# Patient Record
Sex: Female | Born: 1990 | Race: Black or African American | Hispanic: No | Marital: Single | Smoking: Current every day smoker
Health system: Southern US, Community
[De-identification: ages and names within clinical notes are randomized; demographics above are authoritative.]

## PROBLEM LIST (undated history)

## (undated) DIAGNOSIS — N83209 Unspecified ovarian cyst, unspecified side: Secondary | ICD-10-CM

## (undated) HISTORY — DX: Unspecified ovarian cyst, unspecified side: N83.209

---
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
## 2008-01-17 ENCOUNTER — Other Ambulatory Visit: Payer: Self-pay

## 2008-01-17 ENCOUNTER — Inpatient Hospital Stay: Payer: Self-pay | Admitting: Internal Medicine

## 2008-04-14 ENCOUNTER — Emergency Department: Payer: Self-pay | Admitting: Unknown Physician Specialty

## 2008-12-08 ENCOUNTER — Emergency Department: Payer: Self-pay | Admitting: Emergency Medicine

## 2009-05-23 ENCOUNTER — Emergency Department: Payer: Self-pay | Admitting: Emergency Medicine

## 2010-05-23 ENCOUNTER — Emergency Department: Payer: Self-pay | Admitting: Internal Medicine

## 2011-01-22 ENCOUNTER — Emergency Department: Payer: Self-pay | Admitting: Emergency Medicine

## 2011-03-20 ENCOUNTER — Emergency Department: Payer: Self-pay | Admitting: Emergency Medicine

## 2011-05-27 ENCOUNTER — Emergency Department: Payer: Self-pay | Admitting: Unknown Physician Specialty

## 2012-03-31 ENCOUNTER — Emergency Department: Payer: Self-pay | Admitting: Emergency Medicine

## 2012-03-31 LAB — CBC
HCT: 38.6 % (ref 35.0–47.0)
MCH: 30.7 pg (ref 26.0–34.0)
MCHC: 31.9 g/dL — ABNORMAL LOW (ref 32.0–36.0)
MCV: 96 fL (ref 80–100)
Platelet: 248 10*3/uL (ref 150–440)
RBC: 4.01 10*6/uL (ref 3.80–5.20)
WBC: 6.5 10*3/uL (ref 3.6–11.0)

## 2012-03-31 LAB — COMPREHENSIVE METABOLIC PANEL
Alkaline Phosphatase: 59 U/L (ref 50–136)
BUN: 14 mg/dL (ref 7–18)
Calcium, Total: 9.1 mg/dL (ref 8.5–10.1)
Chloride: 106 mmol/L (ref 98–107)
Co2: 28 mmol/L (ref 21–32)
Creatinine: 0.93 mg/dL (ref 0.60–1.30)
EGFR (African American): >60
EGFR (Non-African Amer.): >60
Osmolality: 283 (ref 275–301)
SGPT (ALT): 18 U/L
Sodium: 142 mmol/L (ref 136–145)
Total Protein: 8.6 g/dL — ABNORMAL HIGH (ref 6.4–8.2)

## 2012-03-31 LAB — URINALYSIS, COMPLETE
Bacteria: NONE SEEN
Bilirubin,UR: NEGATIVE
Ketone: NEGATIVE
Nitrite: NEGATIVE
Ph: 7 (ref 4.5–8.0)
RBC,UR: 5 /HPF (ref 0–5)
Specific Gravity: 1.023 (ref 1.003–1.030)
WBC UR: 12 /HPF (ref 0–5)

## 2012-03-31 LAB — PREGNANCY, URINE: Pregnancy Test, Urine: NEGATIVE m[IU]/mL

## 2013-03-05 ENCOUNTER — Emergency Department: Payer: Self-pay | Admitting: Unknown Physician Specialty

## 2013-03-05 LAB — CBC
HCT: 39.6 % (ref 35.0–47.0)
HGB: 13.3 g/dL (ref 12.0–16.0)
MCH: 32.3 pg (ref 26.0–34.0)
MCV: 96 fL (ref 80–100)
WBC: 6 10*3/uL (ref 3.6–11.0)

## 2013-03-05 LAB — BASIC METABOLIC PANEL
Calcium, Total: 8.8 mg/dL (ref 8.5–10.1)
Chloride: 107 mmol/L (ref 98–107)
Co2: 26 mmol/L (ref 21–32)
Creatinine: 1.02 mg/dL (ref 0.60–1.30)
EGFR (African American): >60
EGFR (Non-African Amer.): >60
Osmolality: 274 (ref 275–301)

## 2013-03-05 LAB — WET PREP, GENITAL

## 2013-03-05 LAB — URINALYSIS, COMPLETE
Blood: NEGATIVE
Glucose,UR: NEGATIVE mg/dL (ref 0–75)
Ph: 6 (ref 4.5–8.0)
Specific Gravity: 1.021 (ref 1.003–1.030)

## 2013-12-12 ENCOUNTER — Emergency Department: Payer: Self-pay | Admitting: Emergency Medicine

## 2013-12-15 ENCOUNTER — Emergency Department: Payer: Self-pay | Admitting: Emergency Medicine

## 2014-03-30 ENCOUNTER — Emergency Department: Payer: Self-pay | Admitting: Emergency Medicine

## 2014-03-30 LAB — URINALYSIS, COMPLETE
BACTERIA: NONE SEEN
BILIRUBIN, UR: NEGATIVE
Glucose,UR: NEGATIVE mg/dL (ref 0–75)
KETONE: NEGATIVE
NITRITE: NEGATIVE
Ph: 6 (ref 4.5–8.0)
Protein: 30
SPECIFIC GRAVITY: 1.027 (ref 1.003–1.030)
Squamous Epithelial: 12
WBC UR: 12 /HPF (ref 0–5)

## 2014-03-30 LAB — PREGNANCY, URINE: Pregnancy Test, Urine: NEGATIVE m[IU]/mL

## 2014-06-20 ENCOUNTER — Emergency Department: Payer: Self-pay | Admitting: Emergency Medicine

## 2014-07-21 ENCOUNTER — Emergency Department: Payer: Self-pay | Admitting: Emergency Medicine

## 2014-08-06 ENCOUNTER — Emergency Department: Payer: Self-pay | Admitting: Emergency Medicine

## 2014-08-08 LAB — BETA STREP CULTURE(ARMC)

## 2015-02-09 ENCOUNTER — Emergency Department: Payer: Self-pay | Admitting: Emergency Medicine

## 2015-08-09 ENCOUNTER — Emergency Department
Admission: EM | Admit: 2015-08-09 | Discharge: 2015-08-09 | Disposition: A | Discharge status: Home / Self Care | Payer: No Typology Code available for payment source | Attending: Emergency Medicine | Admitting: Emergency Medicine

## 2015-08-09 ENCOUNTER — Encounter: Payer: Self-pay | Admitting: Emergency Medicine

## 2015-08-09 DIAGNOSIS — W2209XA Striking against other stationary object, initial encounter: Secondary | ICD-10-CM | POA: Diagnosis not present

## 2015-08-09 DIAGNOSIS — Y9389 Activity, other specified: Secondary | ICD-10-CM | POA: Insufficient documentation

## 2015-08-09 DIAGNOSIS — Z720 Tobacco use: Secondary | ICD-10-CM | POA: Insufficient documentation

## 2015-08-09 DIAGNOSIS — Y9289 Other specified places as the place of occurrence of the external cause: Secondary | ICD-10-CM | POA: Diagnosis not present

## 2015-08-09 DIAGNOSIS — S0592XA Unspecified injury of left eye and orbit, initial encounter: Secondary | ICD-10-CM | POA: Diagnosis not present

## 2015-08-09 DIAGNOSIS — Y998 Other external cause status: Secondary | ICD-10-CM | POA: Insufficient documentation

## 2015-08-09 MED ORDER — CIPROFLOXACIN HCL 0.3 % OP SOLN
OPHTHALMIC | Status: AC
  Filled 2015-08-09: qty 2.5

## 2015-08-09 MED ORDER — KETOROLAC TROMETHAMINE 0.5 % OP SOLN: 1.0000 [drp] | Freq: Four times a day (QID) | OPHTHALMIC | Status: DC

## 2015-08-09 MED ORDER — CIPROFLOXACIN HCL 0.3 % OP SOLN
2.0000 [drp] | OPHTHALMIC | Status: DC
  Administered 2015-08-09: 2 [drp] via OPHTHALMIC

## 2015-08-09 MED ORDER — HYDROCODONE-ACETAMINOPHEN 5-325 MG PO TABS
1.0000 | ORAL_TABLET | Freq: Once | ORAL | Status: AC
  Administered 2015-08-09: 1 via ORAL
  Filled 2015-08-09: qty 1

## 2015-08-09 MED ORDER — CIPROFLOXACIN HCL 0.3 % OP SOLN: 1.0000 [drp] | OPHTHALMIC | Status: AC

## 2015-08-09 NOTE — Discharge Instructions (Signed)
Please follow up with the eye doctor for symptoms that are not improving over the next 2 days. Return to the ER for symptoms that change or worsen if unable to schedule an appointment.

## 2015-08-09 NOTE — ED Notes (Signed)
Pt to ed with c/o left eye pain after hitting her eye on a table 3 days ago.

## 2015-08-09 NOTE — ED Provider Notes (Signed)
[facility] Emergency Department Provider Note ____________________________________________  Time seen: Approximately 8:23 PM  I have reviewed the triage vital signs and the nursing notes.   HISTORY  Chief Complaint Eye Injury   HPI Alexis Francis is a 24 y.o. female who presents to the emergency department for evaluation of left eye pain. She states that she bumped into a table about 3 days ago, but the pain didn't start until today while at work. She denies other injury. She states that when she can keep the eye open, her vision isnormal.  Pain is mainly in the "top" of the eye.   History reviewed. No pertinent past medical history.  There are no active problems to display for this patient.   History reviewed. No pertinent past surgical history.  Current Outpatient Rx  Name  Route  Sig  Dispense  Refill  . ciprofloxacin (CILOXAN) 0.3 % ophthalmic solution   Right Eye   Place 1 drop into the right eye every 2 (two) hours. Administer 1 drop, every 2 hours, while awake, for 2 days. Then 1 drop, every 4 hours, while awake, for the next 5 days.   5 mL   0   . ketorolac (ACULAR) 0.5 % ophthalmic solution   Left Eye   Place 1 drop into the left eye 4 (four) times daily.   5 mL   0     Allergies Review of patient's allergies indicates no known allergies.  History reviewed. No pertinent family history.  Social History Social History  Substance Use Topics  . Smoking status: Current Every Day Smoker  . Smokeless tobacco: None  . Alcohol Use: No    Review of Systems   Constitutional: No fever/chills Eyes: Visual changes: no. ENT: No sore throat. Cardiovascular: Denies chest pain. Respiratory: Denies shortness of breath. Gastrointestinal: No abdominal pain.  No nausea, no vomiting.  No diarrhea.  No constipation. Musculoskeletal: Negative for pain. Skin: Negative for rash. Neurological: Negative for headaches, focal weakness or  numbness. Psychiatric:At baseline, no complaint Lymphatic:Swollen nodes-- no Allergic: Seasonal allergies: no 10-point ROS otherwise negative.  ____________________________________________  PHYSICAL EXAM:  VITAL SIGNS: ED Triage Vitals  Enc Vitals Group     BP 08/09/15 1852 138/98 mmHg     Pulse Rate 08/09/15 1852 85     Resp 08/09/15 1852 20     Temp 08/09/15 1852 98.6 F (37 C)     Temp src --      SpO2 08/09/15 1852 98 %     Weight 08/09/15 1852 256 lb (116.121 kg)     Height 08/09/15 1852 5\' 5"  (1.651 m)     Head Cir --      Peak Flow --      Pain Score 08/09/15 1853 5     Pain Loc --      Pain Edu? --      Excl. in GC? --     Constitutional: Alert and oriented. Well appearing and in no acute distress. Eyes: Visual acuity--see nursing documentation; No globe trauma; Eyelids normal to inspection; Everted for exam yes; Conjunctiva and sclera: erythematous; Corneas: no abrasion ; Examined with fluorescein yes; EOM's intact; Pupils PERRLA; Anterior Chambers normal with limited exam.  Head: Atraumatic. Nose: No congestion/rhinnorhea. Mouth/Throat: Mucous membranes are moist.  Oropharynx non-erythematous. Neck: No stridor.  Cardiovascular: Normal rate, regular rhythm. Grossly normal heart sounds.  Good peripheral circulation. Respiratory: Normal respiratory effort.  No retractions. Gastrointestinal: Soft and nontender. No distention. No abdominal  bruits. No CVA tenderness. Musculoskeletal:Normal ROM Neurologic:  Normal speech and language. No gross focal neurologic deficits are appreciated. Speech is normal. No gait instability. Skin:  Skin is warm, dry and intact. No rash noted. Psychiatric: Mood and affect are normal. Speech and behavior are normal.  ____________________________________________   LABS (all labs ordered are listed, but only abnormal results are displayed)  Labs Reviewed - No data to  display ____________________________________________  EKG  ____________________________________________  RADIOLOGY  ____________________________________________   PROCEDURES  Procedure(s) performed:   TonoPen used to measure pressure in left eye. Pressure read as 12.  ____________________________________________   INITIAL IMPRESSION / ASSESSMENT AND PLAN / ED COURSE  Pertinent labs & imaging results that were available during my care of the patient were reviewed by me and considered in my medical decision making (see chart for details).  Patient was advised to follow up with opthalmology for symptoms that are not improving over the next 2 days. She was  also advised to return to the ER for symptoms that change or worsen if unable to schedule an appointment.  ____________________________________________   FINAL CLINICAL IMPRESSION(S) / ED DIAGNOSES  Final diagnoses:  Eye injury, non-penetrating, left, initial encounter       Chinita Pester, FNP 08/09/15 2117  Jennye Moccasin, MD 08/09/15 2222

## 2015-12-30 ENCOUNTER — Encounter: Payer: Self-pay | Admitting: Emergency Medicine

## 2015-12-30 ENCOUNTER — Emergency Department
Admission: EM | Admit: 2015-12-30 | Discharge: 2015-12-30 | Disposition: A | Discharge status: Home / Self Care | Payer: No Typology Code available for payment source | Attending: Student | Admitting: Student

## 2015-12-30 DIAGNOSIS — R350 Frequency of micturition: Secondary | ICD-10-CM | POA: Diagnosis present

## 2015-12-30 DIAGNOSIS — Z791 Long term (current) use of non-steroidal anti-inflammatories (NSAID): Secondary | ICD-10-CM | POA: Diagnosis not present

## 2015-12-30 DIAGNOSIS — B373 Candidiasis of vulva and vagina: Secondary | ICD-10-CM | POA: Diagnosis not present

## 2015-12-30 DIAGNOSIS — A5901 Trichomonal vulvovaginitis: Secondary | ICD-10-CM | POA: Diagnosis not present

## 2015-12-30 DIAGNOSIS — B9689 Other specified bacterial agents as the cause of diseases classified elsewhere: Secondary | ICD-10-CM

## 2015-12-30 DIAGNOSIS — N760 Acute vaginitis: Secondary | ICD-10-CM | POA: Insufficient documentation

## 2015-12-30 DIAGNOSIS — F17200 Nicotine dependence, unspecified, uncomplicated: Secondary | ICD-10-CM | POA: Diagnosis not present

## 2015-12-30 DIAGNOSIS — B3731 Acute candidiasis of vulva and vagina: Secondary | ICD-10-CM

## 2015-12-30 LAB — URINALYSIS COMPLETE WITH MICROSCOPIC (ARMC ONLY)
BACTERIA UA: NONE SEEN
BILIRUBIN URINE: NEGATIVE
GLUCOSE, UA: NEGATIVE mg/dL
HGB URINE DIPSTICK: NEGATIVE
Ketones, ur: NEGATIVE mg/dL
Nitrite: NEGATIVE
Protein, ur: NEGATIVE mg/dL
Specific Gravity, Urine: 1.019 (ref 1.005–1.030)
pH: 5 (ref 5.0–8.0)

## 2015-12-30 LAB — WET PREP, GENITAL: Sperm: NONE SEEN

## 2015-12-30 LAB — CHLAMYDIA/NGC RT PCR (ARMC ONLY)
CHLAMYDIA TR: NOT DETECTED
N GONORRHOEAE: NOT DETECTED

## 2015-12-30 MED ORDER — METRONIDAZOLE 500 MG PO TABS: 500.0000 mg | ORAL_TABLET | Freq: Two times a day (BID) | ORAL | Status: DC

## 2015-12-30 MED ORDER — FLUCONAZOLE 150 MG PO TABS: 150.0000 mg | ORAL_TABLET | Freq: Every day | ORAL | Status: DC

## 2015-12-30 NOTE — Discharge Instructions (Signed)
Bacterial Vaginosis Bacterial vaginosis is an infection of the vagina. It happens when too many germs (bacteria) grow in the vagina. Having this infection puts you at risk for getting other infections from sex. Treating this infection can help lower your risk for other infections, such as:   Chlamydia.  Gonorrhea.  HIV.  Herpes. HOME CARE  Take your medicine as told by your doctor.  Finish your medicine even if you start to feel better.  Tell your sex partner that you have an infection. They should see their doctor for treatment.  During treatment:  Avoid sex or use condoms correctly.  Do not douche.  Do not drink alcohol unless your doctor tells you it is ok.  Do not breastfeed unless your doctor tells you it is ok. GET HELP IF:  You are not getting better after 3 days of treatment.  You have more grey fluid (discharge) coming from your vagina than before.  You have more pain than before.  You have a fever. MAKE SURE YOU:   Understand these instructions.  Will watch your condition.  Will get help right away if you are not doing well or get worse.   This information is not intended to replace advice given to you by your health care provider. Make sure you discuss any questions you have with your health care provider.   Document Released: 09/18/2008 Document Revised: 12/31/2014 Document Reviewed: 07/22/2013 Elsevier Interactive Patient Education 2016 [employer]    Take all medication as prescribed. Do not mix alcohol with Flagyl. Follow-up with health department if any continued problems. Have partner tested at the health Department.

## 2015-12-30 NOTE — ED Notes (Signed)
Reports burning with urination

## 2015-12-30 NOTE — ED Provider Notes (Signed)
[facility] Emergency Department Provider Note  ____________________________________________  Time seen: Approximately 9:25 AM  I have reviewed the triage vital signs and the nursing notes.   HISTORY  Chief Complaint Hematuria  HPI Alexis Francis is a 25 y.o. female is here with complaint of urinary frequency and urgency 2 weeks. Patient states she has low back pain and had a vaginal discharge prior to the symptoms above. She reports burning on urination, no fever no chills and no CVA tenderness. Patient states she had a urinary tract infection a year ago and doesn't believe that it never cleared up however she was never seen again due to the fact she does not have primary care doctor.She is unaware of her partner having any problems such as discharge. She rates her pain as 5 out of 10.   History reviewed. No pertinent past medical history.  There are no active problems to display for this patient.   History reviewed. No pertinent past surgical history.  Current Outpatient Rx  Name  Route  Sig  Dispense  Refill  . fluconazole (DIFLUCAN) 150 MG tablet   Oral   Take 1 tablet (150 mg total) by mouth daily.   1 tablet   0   . ketorolac (ACULAR) 0.5 % ophthalmic solution   Left Eye   Place 1 drop into the left eye 4 (four) times daily.   5 mL   0   . metroNIDAZOLE (FLAGYL) 500 MG tablet   Oral   Take 1 tablet (500 mg total) by mouth 2 (two) times daily.   14 tablet   0     Allergies Review of patient's allergies indicates no known allergies.  History reviewed. No pertinent family history.  Social History Social History  Substance Use Topics  . Smoking status: Current Every Day Smoker  . Smokeless tobacco: None  . Alcohol Use: No    Review of Systems Constitutional: No fever/chills ENT: No sore throat. Cardiovascular: Denies chest pain. Respiratory: Denies shortness of breath. Gastrointestinal: No abdominal pain.  No nausea, no  vomiting.   Genitourinary: Positive for dysuria. Positive vaginal discharge Musculoskeletal: Negative for back pain. Skin: Negative for rash. Neurological: Negative for headaches, focal weakness or numbness.  10-point ROS otherwise negative.  ____________________________________________   PHYSICAL EXAM:  VITAL SIGNS: ED Triage Vitals  Enc Vitals Group     BP 12/30/15 0851 118/78 mmHg     Pulse Rate 12/30/15 0851 78     Resp 12/30/15 0851 18     Temp 12/30/15 0851 98.4 F (36.9 C)     Temp Source 12/30/15 0851 Oral     SpO2 12/30/15 0851 100 %     Weight 12/30/15 0851 260 lb (117.935 kg)     Height 12/30/15 0851 5\' 5"  (1.651 m)     Head Cir --      Peak Flow --      Pain Score 12/30/15 0851 5     Pain Loc --      Pain Edu? --      Excl. in GC? --     Constitutional: Alert and oriented. Well appearing and in no acute distress. Eyes: Conjunctivae are normal. PERRL. EOMI. Head: Atraumatic. Nose: No congestion/rhinnorhea. Neck: No stridor.   Cardiovascular: Normal rate, regular rhythm. Grossly normal heart sounds.  Good peripheral circulation. Respiratory: Normal respiratory effort.  No retractions. Lungs CTAB. Gastrointestinal: Soft and nontender. No distention. No CVA tenderness. Genitourinary: Pelvic exam shows white thick vaginal secretions.  No adnexal masses or tenderness noted and no cervical motion tenderness present. Wet prep and cultures were obtained. Musculoskeletal: Moves upper and lower extremities without any difficulty. Normal gait was noted. Neurologic:  Normal speech and language. No gross focal neurologic deficits are appreciated. No gait instability. Skin:  Skin is warm, dry and intact. No rash noted. Psychiatric: Mood and affect are normal. Speech and behavior are normal.  ____________________________________________   LABS (all labs ordered are listed, but only abnormal results are displayed)  Labs Reviewed  WET PREP, GENITAL - Abnormal; Notable  for the following:    Yeast Wet Prep HPF POC PRESENT (*)    Trich, Wet Prep PRESENT (*)    Clue Cells Wet Prep HPF POC PRESENT (*)    WBC, Wet Prep HPF POC RARE (*)    All other components within normal limits  URINALYSIS COMPLETEWITH MICROSCOPIC ([facility] ONLY) - Abnormal; Notable for the following:    Color, Urine YELLOW (*)    APPearance HAZY (*)    Leukocytes, UA 1+ (*)    Squamous Epithelial / LPF 0-5 (*)    All other components within normal limits  CHLAMYDIA/NGC RT PCR ([facility] ONLY)   PROCEDURES  Procedure(s) performed: None  Critical Care performed: No  ____________________________________________   INITIAL IMPRESSION / ASSESSMENT AND PLAN / ED COURSE  Pertinent labs & imaging results that were available during my care of the patient were reviewed by me and considered in my medical decision making (see chart for details).  Wet prep was positive for yeast, Trichomonas, and clue cells. Patient was treated with Diflucan 150 mg by mouth and Flagyl 500 mg twice a day for 7 days. Patient is to follow-up with [facility] health Department and also tell her sexual partner to go to the health Department as well. ____________________________________________   FINAL CLINICAL IMPRESSION(S) / ED DIAGNOSES  Final diagnoses:  Trichomonas vaginitis  Bacterial vaginal infection  Yeast vaginitis      Tommi Rumps, PA-C 12/30/15 1143  Gayla Doss, MD 12/30/15 1547

## 2015-12-30 NOTE — ED Notes (Signed)
Pt states urinary frequency and urgency, states lower back pain, denies any blood in her urine

## 2016-02-10 ENCOUNTER — Emergency Department
Admission: EM | Admit: 2016-02-10 | Discharge: 2016-02-10 | Disposition: A | Discharge status: Home / Self Care | Payer: No Typology Code available for payment source | Attending: Emergency Medicine | Admitting: Emergency Medicine

## 2016-02-10 ENCOUNTER — Encounter: Payer: Self-pay | Admitting: Emergency Medicine

## 2016-02-10 DIAGNOSIS — X501XXA Overexertion from prolonged static or awkward postures, initial encounter: Secondary | ICD-10-CM | POA: Insufficient documentation

## 2016-02-10 DIAGNOSIS — Z792 Long term (current) use of antibiotics: Secondary | ICD-10-CM | POA: Diagnosis not present

## 2016-02-10 DIAGNOSIS — Z79899 Other long term (current) drug therapy: Secondary | ICD-10-CM | POA: Insufficient documentation

## 2016-02-10 DIAGNOSIS — F17200 Nicotine dependence, unspecified, uncomplicated: Secondary | ICD-10-CM | POA: Insufficient documentation

## 2016-02-10 DIAGNOSIS — Y9289 Other specified places as the place of occurrence of the external cause: Secondary | ICD-10-CM | POA: Diagnosis not present

## 2016-02-10 DIAGNOSIS — Y990 Civilian activity done for income or pay: Secondary | ICD-10-CM | POA: Diagnosis not present

## 2016-02-10 DIAGNOSIS — M62830 Muscle spasm of back: Secondary | ICD-10-CM | POA: Insufficient documentation

## 2016-02-10 DIAGNOSIS — Y93F2 Activity, caregiving, lifting: Secondary | ICD-10-CM | POA: Diagnosis not present

## 2016-02-10 DIAGNOSIS — Z791 Long term (current) use of non-steroidal anti-inflammatories (NSAID): Secondary | ICD-10-CM | POA: Insufficient documentation

## 2016-02-10 DIAGNOSIS — M549 Dorsalgia, unspecified: Secondary | ICD-10-CM | POA: Diagnosis present

## 2016-02-10 MED ORDER — NAPROXEN 500 MG PO TABS: 500.0000 mg | ORAL_TABLET | Freq: Two times a day (BID) | ORAL | Status: DC

## 2016-02-10 MED ORDER — METHOCARBAMOL 500 MG PO TABS: 500.0000 mg | ORAL_TABLET | Freq: Four times a day (QID) | ORAL | Status: DC

## 2016-02-10 NOTE — ED Provider Notes (Signed)
[facility] Emergency Department Provider Note  ____________________________________________  Time seen: Approximately 11:03 AM  I have reviewed the triage vital signs and the nursing notes.   HISTORY  Chief Complaint Back Pain    HPI Alexis Francis is a 25 y.o. female who presents emergency department complaining of mid back pain 2-3 days. Patient denies any injury but states that she does lifting at work. Patient states it is a tight/burning sensation. She denies any urinary complaints of hematuria, polyuria, dysuria. Patient denies any radicular symptoms at this time.   History reviewed. No pertinent past medical history.  There are no active problems to display for this patient.   History reviewed. No pertinent past surgical history.  Current Outpatient Rx  Name  Route  Sig  Dispense  Refill  . fluconazole (DIFLUCAN) 150 MG tablet   Oral   Take 1 tablet (150 mg total) by mouth daily.   1 tablet   0   . ketorolac (ACULAR) 0.5 % ophthalmic solution   Left Eye   Place 1 drop into the left eye 4 (four) times daily.   5 mL   0   . methocarbamol (ROBAXIN) 500 MG tablet   Oral   Take 1 tablet (500 mg total) by mouth 4 (four) times daily.   16 tablet   0   . metroNIDAZOLE (FLAGYL) 500 MG tablet   Oral   Take 1 tablet (500 mg total) by mouth 2 (two) times daily.   14 tablet   0   . naproxen (NAPROSYN) 500 MG tablet   Oral   Take 1 tablet (500 mg total) by mouth 2 (two) times daily with a meal.   60 tablet   0     Allergies Review of patient's allergies indicates no known allergies.  No family history on file.  Social History Social History  Substance Use Topics  . Smoking status: Current Every Day Smoker  . Smokeless tobacco: None  . Alcohol Use: No     Review of Systems  Constitutional: No fever/chills Cardiovascular: no chest pain. Respiratory: no cough. No SOB. Gastrointestinal: No abdominal pain.  No nausea, no  vomiting.  No diarrhea.  No constipation. Genitourinary: Negative for dysuria. No hematuria Musculoskeletal: Positive for bilateral mid back pain. Skin: Negative for rash. Neurological: Negative for headaches, focal weakness or numbness. 10-point ROS otherwise negative.  ____________________________________________   PHYSICAL EXAM:  VITAL SIGNS: ED Triage Vitals  Enc Vitals Group     BP 02/10/16 0920 121/69 mmHg     Pulse Rate 02/10/16 0920 82     Resp 02/10/16 0920 18     Temp 02/10/16 0920 98.2 F (36.8 C)     Temp Source 02/10/16 0920 Oral     SpO2 02/10/16 0920 100 %     Weight 02/10/16 0920 260 lb (117.935 kg)     Height 02/10/16 0920  (1.651 m)     Head Cir --      Peak Flow --      Pain Score 02/10/16 0921 4     Pain Loc --      Pain Edu? --      Excl. in GC? --      Constitutional: Alert and oriented. Well appearing and in no acute distress. Eyes: Conjunctivae are normal. PERRL. EOMI. Head: Atraumatic. Neck: No stridor.  No cervical spine tenderness to palpation. Cardiovascular: Normal rate, regular rhythm. Normal S1 and S2.  Good peripheral circulation. Respiratory: Normal  respiratory effort without tachypnea or retractions. Lungs CTAB. Gastrointestinal:  No CVA tenderness. Musculoskeletal: No lower extremity tenderness nor edema.  No joint effusions. No visible deformity to spine upon inspection. Patient is diffusely tender palpation over the lower thoracic paraspinal muscle groups. Mild spasms are palpated. No palpable abnormality. Negative straight leg raise bilaterally. Dorsalis pedis pulse intact bilaterally. Sensation intact and equal lower extremities. Neurologic:  Normal speech and language. No gross focal neurologic deficits are appreciated.  Skin:  Skin is warm, dry and intact. No rash noted. Psychiatric: Mood and affect are normal. Speech and behavior are normal. Patient exhibits appropriate insight and  judgement.   ____________________________________________   LABS (all labs ordered are listed, but only abnormal results are displayed)  Labs Reviewed - No data to display ____________________________________________  EKG   ____________________________________________  RADIOLOGY   No results found.  ____________________________________________    PROCEDURES  Procedure(s) performed:       Medications - No data to display   ____________________________________________   INITIAL IMPRESSION / ASSESSMENT AND PLAN / ED COURSE  Pertinent labs & imaging results that were available during my care of the patient were reviewed by me and considered in my medical decision making (see chart for details).  Patient's diagnosis is consistent with paraspinal muscle spasms. Patient will be discharged home with prescriptions for anti-inflammatories and muscle relaxers. Patient is to follow up with primary care provider if symptoms persist past this treatment course. Patient is given ED precautions to return to the ED for any worsening or new symptoms.     ____________________________________________  FINAL CLINICAL IMPRESSION(S) / ED DIAGNOSES  Final diagnoses:  Lumbar paraspinal muscle spasm      NEW MEDICATIONS STARTED DURING THIS VISIT:  New Prescriptions   METHOCARBAMOL (ROBAXIN) 500 MG TABLET    Take 1 tablet (500 mg total) by mouth 4 (four) times daily.   NAPROXEN (NAPROSYN) 500 MG TABLET    Take 1 tablet (500 mg total) by mouth 2 (two) times daily with a meal.        Racheal Patches, PA-C 02/10/16 1114  Jennye Moccasin, MD 02/10/16 1158

## 2016-02-10 NOTE — Discharge Instructions (Signed)
Back Exercises °The following exercises strengthen the muscles that help to support the back. They also help to keep the lower back flexible. Doing these exercises can help to prevent back pain or lessen existing pain. °If you have back pain or discomfort, try doing these exercises 2-3 times each day or as told by your health care provider. When the pain goes away, do them once each day, but increase the number of times that you repeat the steps for each exercise (do more repetitions). If you do not have back pain or discomfort, do these exercises once each day or as told by your health care provider. °EXERCISES °Single Knee to Chest °Repeat these steps 3-5 times for each leg: °· Lie on your back on a firm bed or the floor with your legs extended. °· Bring one knee to your chest. Your other leg should stay extended and in contact with the floor. °· Hold your knee in place by grabbing your knee or thigh. °· Pull on your knee until you feel a gentle stretch in your lower back. °· Hold the stretch for 10-30 seconds. °· Slowly release and straighten your leg. °Pelvic Tilt °Repeat these steps 5-10 times: °· Lie on your back on a firm bed or the floor with your legs extended. °· Bend your knees so they are pointing toward the ceiling and your feet are flat on the floor. °· Tighten your lower abdominal muscles to press your lower back against the floor. This motion will tilt your pelvis so your tailbone points up toward the ceiling instead of pointing to your feet or the floor. °· With gentle tension and even breathing, hold this position for 5-10 seconds. °Cat-Cow °Repeat these steps until your lower back becomes more flexible: °· Get into a hands-and-knees position on a firm surface. Keep your hands under your shoulders, and keep your knees under your hips. You may place padding under your knees for comfort. °· Let your head hang down, and point your tailbone toward the floor so your lower back becomes rounded like the  back of a cat. °· Hold this position for 5 seconds. °· Slowly lift your head and point your tailbone up toward the ceiling so your back forms a sagging arch like the back of a cow. °· Hold this position for 5 seconds. °Press-Ups °Repeat these steps 5-10 times: °· Lie on your abdomen (face-down) on the floor. °· Place your palms near your head, about shoulder-width apart. °· While you keep your back as relaxed as possible and keep your hips on the floor, slowly straighten your arms to raise the top half of your body and lift your shoulders. Do not use your back muscles to raise your upper torso. You may adjust the placement of your hands to make yourself more comfortable. °· Hold this position for 5 seconds while you keep your back relaxed. °· Slowly return to lying flat on the floor. °Bridges °Repeat these steps 10 times: °· Lie on your back on a firm surface. °· Bend your knees so they are pointing toward the ceiling and your feet are flat on the floor. °· Tighten your buttocks muscles and lift your buttocks off of the floor until your waist is at almost the same height as your knees. You should feel the muscles working in your buttocks and the back of your thighs. If you do not feel these muscles, slide your feet 1-2 inches farther away from your buttocks. °· Hold this position for 3-5   seconds. °· Slowly lower your hips to the starting position, and allow your buttocks muscles to relax completely. °If this exercise is too easy, try doing it with your arms crossed over your chest. °Abdominal Crunches °Repeat these steps 5-10 times: °1. Lie on your back on a firm bed or the floor with your legs extended. °2. Bend your knees so they are pointing toward the ceiling and your feet are flat on the floor. °3. Cross your arms over your chest. °4. Tip your chin slightly toward your chest without bending your neck. °5. Tighten your abdominal muscles and slowly raise your trunk (torso) high enough to lift your shoulder blades  a tiny bit off of the floor. Avoid raising your torso higher than that, because it can put too much stress on your low back and it does not help to strengthen your abdominal muscles. °6. Slowly return to your starting position. °Back Lifts °Repeat these steps 5-10 times: °1. Lie on your abdomen (face-down) with your arms at your sides, and rest your forehead on the floor. °2. Tighten the muscles in your legs and your buttocks. °3. Slowly lift your chest off of the floor while you keep your hips pressed to the floor. Keep the back of your head in line with the curve in your back. Your eyes should be looking at the floor. °4. Hold this position for 3-5 seconds. °5. Slowly return to your starting position. °SEEK MEDICAL CARE IF: °· Your back pain or discomfort gets much worse when you do an exercise. °· Your back pain or discomfort does not lessen within 2 hours after you exercise. °If you have any of these problems, stop doing these exercises right away. Do not do them again unless your health care provider says that you can. °SEEK IMMEDIATE MEDICAL CARE IF: °· You develop sudden, severe back pain. If this happens, stop doing the exercises right away. Do not do them again unless your health care provider says that you can. °  °This information is not intended to replace advice given to you by your health care provider. Make sure you discuss any questions you have with your health care provider. °  °Document Released: 01/17/2005 Document Revised: 08/31/2015 Document Reviewed: 02/03/2015 °Elsevier Interactive Patient Education ©2016 [employer] ° °Heat Therapy °Heat therapy can help ease sore, stiff, injured, and tight muscles and joints. Heat relaxes your muscles, which may help ease your pain.  °RISKS AND COMPLICATIONS °If you have any of the following conditions, do not use heat therapy unless your health care provider has approved: °· Poor circulation. °· Healing wounds or scarred skin in the area being  treated. °· Diabetes, heart disease, or high blood pressure. °· Not being able to feel (numbness) the area being treated. °· Unusual swelling of the area being treated. °· Active infections. °· Blood clots. °· Cancer. °· Inability to communicate pain. This may include young children and people who have problems with their brain function (dementia). °· Pregnancy. °Heat therapy should only be used on old, pre-existing, or long-lasting (chronic) injuries. Do not use heat therapy on new injuries unless directed by your health care provider. °HOW TO USE HEAT THERAPY °There are several different kinds of heat therapy, including: °· Moist heat pack. °· Warm water bath. °· Hot water bottle. °· Electric heating pad. °· Heated gel pack. °· Heated wrap. °· Electric heating pad. °Use the heat therapy method suggested by your health care provider. Follow your health care provider's instructions on when   and how to use heat therapy. °GENERAL HEAT THERAPY RECOMMENDATIONS °· Do not sleep while using heat therapy. Only use heat therapy while you are awake. °· Your skin may turn pink while using heat therapy. Do not use heat therapy if your skin turns red. °· Do not use heat therapy if you have new pain. °· High heat or long exposure to heat can cause burns. Be careful when using heat therapy to avoid burning your skin. °· Do not use heat therapy on areas of your skin that are already irritated, such as with a rash or sunburn. °SEEK MEDICAL CARE IF: °· You have blisters, redness, swelling, or numbness. °· You have new pain. °· Your pain is worse. °MAKE SURE YOU: °· Understand these instructions. °· Will watch your condition. °· Will get help right away if you are not doing well or get worse. °  °This information is not intended to replace advice given to you by your health care provider. Make sure you discuss any questions you have with your health care provider. °  °Document Released: 03/03/2012 Document Revised: 12/31/2014 Document  Reviewed: 02/02/2014 °Elsevier Interactive Patient Education ©2016 [employer] ° °Muscle Cramps and Spasms °Muscle cramps and spasms occur when a muscle or muscles tighten and you have no control over this tightening (involuntary muscle contraction). They are a common problem and can develop in any muscle. The most common place is in the calf muscles of the leg. Both muscle cramps and muscle spasms are involuntary muscle contractions, but they also have differences:  °· Muscle cramps are sporadic and painful. They may last a few seconds to a quarter of an hour. Muscle cramps are often more forceful and last longer than muscle spasms. °· Muscle spasms may or may not be painful. They may also last just a few seconds or much longer. °CAUSES  °It is uncommon for cramps or spasms to be due to a serious underlying problem. In many cases, the cause of cramps or spasms is unknown. Some common causes are:  °· Overexertion.   °· Overuse from repetitive motions (doing the same thing over and over).   °· Remaining in a certain position for a long period of time.   °· Improper preparation, form, or technique while performing a sport or activity.   °· Dehydration.   °· Injury.   °· Side effects of some medicines.   °· Abnormally low levels of the salts and ions in your blood (electrolytes), especially potassium and calcium. This could happen if you are taking water pills (diuretics) or you are pregnant.   °Some underlying medical problems can make it more likely to develop cramps or spasms. These include, but are not limited to:  °· Diabetes.   °· Parkinson disease.   °· Hormone disorders, such as thyroid problems.   °· Alcohol abuse.   °· Diseases specific to muscles, joints, and bones.   °· Blood vessel disease where not enough blood is getting to the muscles.   °HOME CARE INSTRUCTIONS  °· Stay well hydrated. Drink enough water and fluids to keep your urine clear or pale yellow. °· It may be helpful to massage, stretch, and  relax the affected muscle. °· For tight or tense muscles, use a warm towel, heating pad, or hot shower water directed to the affected area. °· If you are sore or have pain after a cramp or spasm, applying ice to the affected area may relieve discomfort. °¨ Put ice in a plastic bag. °¨ Place a towel between your skin and the bag. °¨ Leave the ice   on for 15-20 minutes, 03-04 times a day. °· Medicines used to treat a known cause of cramps or spasms may help reduce their frequency or severity. Only take over-the-counter or prescription medicines as directed by your caregiver. °SEEK MEDICAL CARE IF:  °Your cramps or spasms get more severe, more frequent, or do not improve over time.  °MAKE SURE YOU:  °· Understand these instructions. °· Will watch your condition. °· Will get help right away if you are not doing well or get worse. °  °This information is not intended to replace advice given to you by your health care provider. Make sure you discuss any questions you have with your health care provider. °  °Document Released: 06/01/2002 Document Revised: 04/06/2013 Document Reviewed: 11/26/2012 °Elsevier Interactive Patient Education ©2016 [employer] ° °

## 2016-02-10 NOTE — ED Notes (Signed)
States she developed mid back pain for the past couple of days .Marland KitchenMarland Kitchenpain is mainly at bilateral flank areas. Unsure of injury   No fever   Or urinary sx's. Ambulates well to treatment.

## 2016-02-10 NOTE — ED Notes (Signed)
Pt presents with upper pain/strain. States has to do some heavy lifting at work. Has taken tylenol with some relief.

## 2016-03-26 ENCOUNTER — Encounter: Payer: Self-pay | Admitting: Emergency Medicine

## 2016-03-26 ENCOUNTER — Emergency Department
Admission: EM | Admit: 2016-03-26 | Discharge: 2016-03-26 | Disposition: A | Discharge status: Home / Self Care | Payer: No Typology Code available for payment source | Attending: Emergency Medicine | Admitting: Emergency Medicine

## 2016-03-26 DIAGNOSIS — F17200 Nicotine dependence, unspecified, uncomplicated: Secondary | ICD-10-CM | POA: Insufficient documentation

## 2016-03-26 DIAGNOSIS — J209 Acute bronchitis, unspecified: Secondary | ICD-10-CM | POA: Insufficient documentation

## 2016-03-26 DIAGNOSIS — J029 Acute pharyngitis, unspecified: Secondary | ICD-10-CM | POA: Diagnosis present

## 2016-03-26 DIAGNOSIS — J40 Bronchitis, not specified as acute or chronic: Secondary | ICD-10-CM

## 2016-03-26 MED ORDER — PSEUDOEPH-BROMPHEN-DM 30-2-10 MG/5ML PO SYRP: 5.0000 mL | ORAL_SOLUTION | Freq: Four times a day (QID) | ORAL | Status: DC | PRN

## 2016-03-26 MED ORDER — AZITHROMYCIN 500 MG PO TABS: 500.0000 mg | ORAL_TABLET | Freq: Every day | ORAL | Status: DC

## 2016-03-26 NOTE — ED Provider Notes (Signed)
[facility] Emergency Department Provider Note  ____________________________________________  Time seen: Approximately 11:50 AM  I have reviewed the triage vital signs and the nursing notes.   HISTORY  Chief Complaint Sore Throat    HPI Alexis Francis is a 25 y.o. female patient complaining of sore throat, cough, and chest congestion for one week. Patient denies any fever or chills associated this complaint. Patient denies any nausea vomiting diarrhea. No palliative measures taken for this complaint. Patient rates her pain discomfort as a 5/10. Patient describes her pain as "achy". History reviewed. No pertinent past medical history.  There are no active problems to display for this patient.   History reviewed. No pertinent past surgical history.  Current Outpatient Rx  Name  Route  Sig  Dispense  Refill  . azithromycin (ZITHROMAX) 500 MG tablet   Oral   Take 1 tablet (500 mg total) by mouth daily. Take 1 tablet daily for 3 days.   3 tablet   0   . brompheniramine-pseudoephedrine-DM 30-2-10 MG/5ML syrup   Oral   Take 5 mLs by mouth 4 (four) times daily as needed.   120 mL   0   . fluconazole (DIFLUCAN) 150 MG tablet   Oral   Take 1 tablet (150 mg total) by mouth daily.   1 tablet   0   . ketorolac (ACULAR) 0.5 % ophthalmic solution   Left Eye   Place 1 drop into the left eye 4 (four) times daily.   5 mL   0   . methocarbamol (ROBAXIN) 500 MG tablet   Oral   Take 1 tablet (500 mg total) by mouth 4 (four) times daily.   16 tablet   0   . metroNIDAZOLE (FLAGYL) 500 MG tablet   Oral   Take 1 tablet (500 mg total) by mouth 2 (two) times daily.   14 tablet   0   . naproxen (NAPROSYN) 500 MG tablet   Oral   Take 1 tablet (500 mg total) by mouth 2 (two) times daily with a meal.   60 tablet   0     Allergies Review of patient's allergies indicates no known allergies.  No family history on file.  Social History Social History   Substance Use Topics  . Smoking status: Current Every Day Smoker  . Smokeless tobacco: None  . Alcohol Use: No    Review of Systems Constitutional: No fever/chills Eyes: No visual changes. ENT: No sore throat. Cardiovascular: Denies chest pain. Respiratory: Denies shortness of breath. Gastrointestinal: No abdominal pain.  No nausea, no vomiting.  No diarrhea.  No constipation. Genitourinary: Negative for dysuria. Musculoskeletal: Negative for back pain. Skin: Negative for rash. Neurological: Negative for headaches, focal weakness or numbness.  10-point ROS otherwise negative.  ____________________________________________   PHYSICAL EXAM:  VITAL SIGNS: ED Triage Vitals  Enc Vitals Group     BP 03/26/16 1034 119/75 mmHg     Pulse Rate 03/26/16 1034 77     Resp 03/26/16 1034 18     Temp 03/26/16 1034 98.2 F (36.8 C)     Temp Source 03/26/16 1034 Oral     SpO2 03/26/16 1034 100 %     Weight 03/26/16 1034 250 lb (113.399 kg)     Height 03/26/16 1034  (1.651 m)     Head Cir --      Peak Flow --      Pain Score 03/26/16 1035 5     Pain  Loc --      Pain Edu? --      Excl. in GC? --     Constitutional: Alert and oriented. Well appearing and in no acute distress. Eyes: Conjunctivae are normal. PERRL. EOMI. Head: Atraumatic. Nose: Edematous nasal turbinates with clear rhinorrhea. Mouth/Throat: Mucous membranes are moist.  Oropharynx non-erythematous. Neck: No stridor.  No cervical spine tenderness to palpation. Hematological/Lymphatic/Immunilogical: No cervical lymphadenopathy. Cardiovascular: Normal rate, regular rhythm. Grossly normal heart sounds.  Good peripheral circulation. Respiratory: Normal respiratory effort.  No retractions. Lungs bilateral upper lobe Rales. Nonproductive cough. Gastrointestinal: Soft and nontender. No distention. No abdominal bruits. No CVA tenderness. Musculoskeletal: No lower extremity tenderness nor edema.  No joint  effusions. Neurologic:  Normal speech and language. No gross focal neurologic deficits are appreciated. No gait instability. Skin:  Skin is warm, dry and intact. No rash noted. Psychiatric: Mood and affect are normal. Speech and behavior are normal.  ____________________________________________   LABS (all labs ordered are listed, but only abnormal results are displayed)  Labs Reviewed - No data to display ____________________________________________  EKG   ____________________________________________  RADIOLOGY   ____________________________________________   PROCEDURES  Procedure(s) performed: None  Critical Care performed: No  ____________________________________________   INITIAL IMPRESSION / ASSESSMENT AND PLAN / ED COURSE  Pertinent labs & imaging results that were available during my care of the patient were reviewed by me and considered in my medical decision making (see chart for details).  Bronchitis. Patient given discharge care instructions. Patient given a prescription for Zithromax and Bromfed-DM. Patient given a work note. FINAL CLINICAL IMPRESSION(S) / ED DIAGNOSES  Final diagnoses:  Bronchitis      Joni ReiningRonald K Smith, PA-C 03/26/16 1208  Emily FilbertJonathan E Williams, MD 03/27/16 [phone]

## 2016-03-26 NOTE — ED Notes (Signed)
Pt to ed with c/o sore throat, cough, congestion x 1 week.  Pt denies fever.  In no acute resp distress at this time.  Pt throat with noted mild redness.  Pt skin warm and dry.

## 2016-03-26 NOTE — ED Notes (Signed)
Sore throat x1 wk.

## 2016-03-26 NOTE — Discharge Instructions (Signed)

## 2016-04-18 ENCOUNTER — Emergency Department
Admission: EM | Admit: 2016-04-18 | Discharge: 2016-04-18 | Disposition: A | Discharge status: Home / Self Care | Payer: No Typology Code available for payment source | Attending: Emergency Medicine | Admitting: Emergency Medicine

## 2016-04-18 ENCOUNTER — Encounter: Payer: Self-pay | Admitting: Emergency Medicine

## 2016-04-18 DIAGNOSIS — N3000 Acute cystitis without hematuria: Secondary | ICD-10-CM

## 2016-04-18 DIAGNOSIS — F17200 Nicotine dependence, unspecified, uncomplicated: Secondary | ICD-10-CM | POA: Insufficient documentation

## 2016-04-18 DIAGNOSIS — Z791 Long term (current) use of non-steroidal anti-inflammatories (NSAID): Secondary | ICD-10-CM | POA: Diagnosis not present

## 2016-04-18 DIAGNOSIS — R109 Unspecified abdominal pain: Secondary | ICD-10-CM | POA: Diagnosis present

## 2016-04-18 LAB — URINALYSIS COMPLETE WITH MICROSCOPIC (ARMC ONLY)
BILIRUBIN URINE: NEGATIVE
Bacteria, UA: NONE SEEN
Glucose, UA: NEGATIVE mg/dL
KETONES UR: NEGATIVE mg/dL
Nitrite: NEGATIVE
PH: 7 (ref 5.0–8.0)
Protein, ur: NEGATIVE mg/dL
Specific Gravity, Urine: 1.01 (ref 1.005–1.030)

## 2016-04-18 LAB — POCT PREGNANCY, URINE: Preg Test, Ur: NEGATIVE

## 2016-04-18 MED ORDER — SULFAMETHOXAZOLE-TRIMETHOPRIM 800-160 MG PO TABS: 1.0000 | ORAL_TABLET | Freq: Two times a day (BID) | ORAL | Status: DC

## 2016-04-18 MED ORDER — PHENAZOPYRIDINE HCL 200 MG PO TABS: 200.0000 mg | ORAL_TABLET | Freq: Three times a day (TID) | ORAL | Status: DC | PRN

## 2016-04-18 NOTE — ED Notes (Signed)
States she developed lower back pain about 1-2 weeks ago w/o injury. States pain radiates into right hip area. Ambulates well to treatment area. Had some urinary freq   Denies any fever or n/v or vaginal discharge

## 2016-04-18 NOTE — ED Notes (Signed)
Patient to ER for c/o urinary frequency with lower back pain. Patient denies any fevers.

## 2016-04-18 NOTE — Discharge Instructions (Signed)

## 2016-04-18 NOTE — ED Provider Notes (Signed)
[facility] Emergency Department Provider Note ____________________________________________  Time seen: Approximately 8:38 AM  I have reviewed the triage vital signs and the nursing notes.   HISTORY  Chief Complaint Urinary Frequency and Back Pain    HPI Alexis Francis is a 25 y.o. female who presents to the emergency department for evaluation of right flank pain. Pain started 2 weeks ago and continues to get worse. Similar symptoms with previous kidney infections. No dysuria, but only able to urinate small amounts and often. She denies fever. She has not been taking anything for pain.  History reviewed. No pertinent past medical history.  There are no active problems to display for this patient.   History reviewed. No pertinent past surgical history.  Current Outpatient Rx  Name  Route  Sig  Dispense  Refill  . azithromycin (ZITHROMAX) 500 MG tablet   Oral   Take 1 tablet (500 mg total) by mouth daily. Take 1 tablet daily for 3 days.   3 tablet   0   . brompheniramine-pseudoephedrine-DM 30-2-10 MG/5ML syrup   Oral   Take 5 mLs by mouth 4 (four) times daily as needed.   120 mL   0   . fluconazole (DIFLUCAN) 150 MG tablet   Oral   Take 1 tablet (150 mg total) by mouth daily.   1 tablet   0   . ketorolac (ACULAR) 0.5 % ophthalmic solution   Left Eye   Place 1 drop into the left eye 4 (four) times daily.   5 mL   0   . methocarbamol (ROBAXIN) 500 MG tablet   Oral   Take 1 tablet (500 mg total) by mouth 4 (four) times daily.   16 tablet   0   . metroNIDAZOLE (FLAGYL) 500 MG tablet   Oral   Take 1 tablet (500 mg total) by mouth 2 (two) times daily.   14 tablet   0   . naproxen (NAPROSYN) 500 MG tablet   Oral   Take 1 tablet (500 mg total) by mouth 2 (two) times daily with a meal.   60 tablet   0   . phenazopyridine (PYRIDIUM) 200 MG tablet   Oral   Take 1 tablet (200 mg total) by mouth 3 (three) times daily as needed for pain.    9 tablet   0   . sulfamethoxazole-trimethoprim (BACTRIM DS,SEPTRA DS) 800-160 MG tablet   Oral   Take 1 tablet by mouth 2 (two) times daily.   10 tablet   0     Allergies Review of patient's allergies indicates no known allergies.  No family history on file.  Social History Social History  Substance Use Topics  . Smoking status: Current Every Day Smoker  . Smokeless tobacco: None  . Alcohol Use: No    Review of Systems Constitutional: No recent illness. No fever. Gastrointestinal: No abdominal pain.  Genitourinary: Negative for dysuria. Positive for urinary frequency and urinating small amounts. Musculoskeletal: Pain in right mid/lower back Skin: Negative for rash. Neurological: Negative for headaches, focal weakness or numbness. ____________________________________________   PHYSICAL EXAM:  VITAL SIGNS: ED Triage Vitals  Enc Vitals Group     BP 04/18/16 0813 125/76 mmHg     Pulse Rate 04/18/16 0813 90     Resp 04/18/16 0813 18     Temp 04/18/16 0813 98.2 F (36.8 C)     Temp Source 04/18/16 0813 Oral     SpO2 04/18/16 0813 99 %  Weight 04/18/16 0813 250 lb (113.399 kg)     Height 04/18/16 0813 5\' 5"  (1.651 m)     Head Cir --      Peak Flow --      Pain Score 04/18/16 0814 6     Pain Loc --      Pain Edu? --      Excl. in GC? --     Constitutional: Alert and oriented. Well appearing and in no acute distress. Eyes: Conjunctivae are normal. EOMI. Head: Atraumatic. Respiratory: Normal respiratory effort.   Musculoskeletal: Right flank pain.  Neurologic:  Normal speech and language. Speech is normal. Skin:  Skin is warm, dry and intact. Atraumatic. Psychiatric: Mood and affect are normal. Speech and behavior are normal.  ____________________________________________   LABS (all labs ordered are listed, but only abnormal results are displayed)  Labs Reviewed  URINALYSIS COMPLETEWITH MICROSCOPIC ([facility] ONLY) - Abnormal; Notable for the following:     Color, Urine YELLOW (*)    APPearance CLOUDY (*)    Hgb urine dipstick 1+ (*)    Leukocytes, UA 3+ (*)    Squamous Epithelial / LPF 0-5 (*)    All other components within normal limits  POC URINE PREG, ED  POCT PREGNANCY, URINE   ____________________________________________  RADIOLOGY  Not indicated. ____________________________________________   PROCEDURES  Procedure(s) performed: None   ____________________________________________   INITIAL IMPRESSION / ASSESSMENT AND PLAN / ED COURSE  Pertinent labs & imaging results that were available during my care of the patient were reviewed by me and considered in my medical decision making (see chart for details).  Patient will be treated with 5 days of Bactrim and 3 days of Pyridium. She is to follow up with the PCP of her choice in 10-14 days or return to the ER for symptoms that change or worsen if unable to schedule an appointment. ____________________________________________   FINAL CLINICAL IMPRESSION(S) / ED DIAGNOSES  Final diagnoses:  Acute cystitis without hematuria       Chinita PesterCari B Renia Mikelson, FNP 04/18/16 1012  Arnaldo NatalPaul F Malinda, MD 04/18/16 1341

## 2016-04-20 ENCOUNTER — Emergency Department: Payer: No Typology Code available for payment source

## 2016-04-20 ENCOUNTER — Emergency Department
Admission: EM | Admit: 2016-04-20 | Discharge: 2016-04-20 | Disposition: A | Discharge status: Home / Self Care | Payer: No Typology Code available for payment source | Attending: Emergency Medicine | Admitting: Emergency Medicine

## 2016-04-20 ENCOUNTER — Encounter: Payer: Self-pay | Admitting: Emergency Medicine

## 2016-04-20 DIAGNOSIS — Z79899 Other long term (current) drug therapy: Secondary | ICD-10-CM | POA: Diagnosis not present

## 2016-04-20 DIAGNOSIS — R109 Unspecified abdominal pain: Secondary | ICD-10-CM

## 2016-04-20 DIAGNOSIS — Z791 Long term (current) use of non-steroidal anti-inflammatories (NSAID): Secondary | ICD-10-CM | POA: Insufficient documentation

## 2016-04-20 DIAGNOSIS — A599 Trichomoniasis, unspecified: Secondary | ICD-10-CM

## 2016-04-20 DIAGNOSIS — F17200 Nicotine dependence, unspecified, uncomplicated: Secondary | ICD-10-CM | POA: Diagnosis not present

## 2016-04-20 LAB — URINALYSIS COMPLETE WITH MICROSCOPIC (ARMC ONLY)
Bacteria, UA: NONE SEEN
Bilirubin Urine: NEGATIVE
GLUCOSE, UA: NEGATIVE mg/dL
Hgb urine dipstick: NEGATIVE
Leukocytes, UA: NEGATIVE
NITRITE: POSITIVE — AB
PROTEIN: NEGATIVE mg/dL
SPECIFIC GRAVITY, URINE: 1.006 (ref 1.005–1.030)
pH: 6 (ref 5.0–8.0)

## 2016-04-20 LAB — COMPREHENSIVE METABOLIC PANEL
ALT: 14 U/L (ref 14–54)
ANION GAP: 7 (ref 5–15)
AST: 18 U/L (ref 15–41)
Albumin: 4.2 g/dL (ref 3.5–5.0)
Alkaline Phosphatase: 52 U/L (ref 38–126)
BUN: 9 mg/dL (ref 6–20)
CHLORIDE: 106 mmol/L (ref 101–111)
CO2: 23 mmol/L (ref 22–32)
CREATININE: 1.1 mg/dL — AB (ref 0.44–1.00)
Calcium: 9.2 mg/dL (ref 8.9–10.3)
Glucose, Bld: 89 mg/dL (ref 65–99)
Potassium: 4.5 mmol/L (ref 3.5–5.1)
Sodium: 136 mmol/L (ref 135–145)
Total Bilirubin: 0.7 mg/dL (ref 0.3–1.2)
Total Protein: 7.9 g/dL (ref 6.5–8.1)

## 2016-04-20 LAB — CBC WITH DIFFERENTIAL/PLATELET
Basophils Absolute: 0 10*3/uL (ref 0–0.1)
Basophils Relative: 1 %
EOS PCT: 2 %
Eosinophils Absolute: 0.1 10*3/uL (ref 0–0.7)
HCT: 40.1 % (ref 35.0–47.0)
Hemoglobin: 13.5 g/dL (ref 12.0–16.0)
LYMPHS ABS: 1.2 10*3/uL (ref 1.0–3.6)
LYMPHS PCT: 32 %
MCH: 31.7 pg (ref 26.0–34.0)
MCHC: 33.8 g/dL (ref 32.0–36.0)
MCV: 93.8 fL (ref 80.0–100.0)
MONO ABS: 0.4 10*3/uL (ref 0.2–0.9)
MONOS PCT: 11 %
Neutro Abs: 2.1 10*3/uL (ref 1.4–6.5)
Neutrophils Relative %: 54 %
PLATELETS: 256 10*3/uL (ref 150–440)
RBC: 4.28 MIL/uL (ref 3.80–5.20)
RDW: 12.8 % (ref 11.5–14.5)
WBC: 3.9 10*3/uL (ref 3.6–11.0)

## 2016-04-20 LAB — CHLAMYDIA/NGC RT PCR (ARMC ONLY)
Chlamydia Tr: NOT DETECTED
N gonorrhoeae: NOT DETECTED

## 2016-04-20 LAB — WET PREP, GENITAL
Clue Cells Wet Prep HPF POC: NONE SEEN
SPERM: NONE SEEN
Yeast Wet Prep HPF POC: NONE SEEN

## 2016-04-20 MED ORDER — TRAMADOL HCL 50 MG PO TABS: 50.0000 mg | ORAL_TABLET | Freq: Four times a day (QID) | ORAL | Status: DC | PRN

## 2016-04-20 MED ORDER — DEXTROSE 5 % IV SOLN: 1.0000 g | Freq: Once | INTRAVENOUS | Status: DC

## 2016-04-20 MED ORDER — METRONIDAZOLE 500 MG PO TABS
2000.0000 mg | ORAL_TABLET | Freq: Once | ORAL | Status: AC
  Administered 2016-04-20: 2000 mg via ORAL
  Filled 2016-04-20: qty 4

## 2016-04-20 MED ORDER — SODIUM CHLORIDE 0.9 % IV SOLN: Freq: Once | INTRAVENOUS | Status: DC

## 2016-04-20 MED ORDER — METRONIDAZOLE 500 MG PO TABS: 500.0000 mg | ORAL_TABLET | Freq: Two times a day (BID) | ORAL | Status: DC

## 2016-04-20 MED ORDER — OXYCODONE-ACETAMINOPHEN 5-325 MG PO TABS
2.0000 | ORAL_TABLET | Freq: Once | ORAL | Status: AC
  Administered 2016-04-20: 2 via ORAL
  Filled 2016-04-20: qty 2

## 2016-04-20 MED ORDER — HYDROMORPHONE HCL 1 MG/ML IJ SOLN: 0.5000 mg | Freq: Once | INTRAMUSCULAR | Status: DC

## 2016-04-20 NOTE — Discharge Instructions (Signed)
Flank Pain Flank pain refers to pain that is located on the side of the body between the upper abdomen and the back. The pain may occur over a short period of time (acute) or may be long-term or reoccurring (chronic). It may be mild or severe. Flank pain can be caused by many things. CAUSES  Some of the more common causes of flank pain include:  Muscle strains.   Muscle spasms.   A disease of your spine (vertebral disk disease).   A lung infection (pneumonia).   Fluid around your lungs (pulmonary edema).   A kidney infection.   Kidney stones.   A very painful skin rash caused by the chickenpox virus (shingles).   Gallbladder disease.  HOME CARE INSTRUCTIONS  Home care will depend on the cause of your pain. In general,  Rest as directed by your caregiver.  Drink enough fluids to keep your urine clear or pale yellow.  Only take over-the-counter or prescription medicines as directed by your caregiver. Some medicines may help relieve the pain.  Tell your caregiver about any changes in your pain.  Follow up with your caregiver as directed. SEEK IMMEDIATE MEDICAL CARE IF:   Your pain is not controlled with medicine.   You have new or worsening symptoms.  Your pain increases.   You have abdominal pain.   You have shortness of breath.   You have persistent nausea or vomiting.   You have swelling in your abdomen.   You feel faint or pass out.   You have blood in your urine.  You have a fever or persistent symptoms for more than 2-3 days.  You have a fever and your symptoms suddenly get worse. MAKE SURE YOU:   Understand these instructions.  Will watch your condition.  Will get help right away if you are not doing well or get worse.   This information is not intended to replace advice given to you by your health care provider. Make sure you discuss any questions you have with your health care provider.   Document Released: 01/31/2006 Document  Revised: 09/03/2012 Document Reviewed: 07/24/2012 Elsevier Interactive Patient Education 2016 [employer].  Trichomoniasis Trichomoniasis is an infection caused by an organism called Trichomonas. The infection can affect both women and men. In women, the outer female genitalia and the vagina are affected. In men, the penis is mainly affected, but the prostate and other reproductive organs can also be involved. Trichomoniasis is a sexually transmitted infection (STI) and is most often passed to another person through sexual contact.  RISK FACTORS  Having unprotected sexual intercourse.  Having sexual intercourse with an infected partner. SIGNS AND SYMPTOMS  Symptoms of trichomoniasis in women include:  Abnormal gray-green frothy vaginal discharge.  Itching and irritation of the vagina.  Itching and irritation of the area outside the vagina. Symptoms of trichomoniasis in men include:   Penile discharge with or without pain.  Pain during urination. This results from inflammation of the urethra. DIAGNOSIS  Trichomoniasis may be found during a Pap test or physical exam. Your health care provider may use one of the following methods to help diagnose this infection:  Testing the pH of the vagina with a test tape.  Using a vaginal swab test that checks for the Trichomonas organism. A test is available that provides results within a few minutes.  Examining a urine sample.  Testing vaginal secretions. Your health care provider may test you for other STIs, including HIV. TREATMENT   You may  be given medicine to fight the infection. Women should inform their health care provider if they could be or are pregnant. Some medicines used to treat the infection should not be taken during pregnancy.  Your health care provider may recommend over-the-counter medicines or creams to decrease itching or irritation.  Your sexual partner will need to be treated if infected.  Your health care  provider may test you for infection again 3 months after treatment. HOME CARE INSTRUCTIONS   Take medicines only as directed by your health care provider.  Take over-the-counter medicine for itching or irritation as directed by your health care provider.  Do not have sexual intercourse while you have the infection.  Women should not douche or wear tampons while they have the infection.  Discuss your infection with your partner. Your partner may have gotten the infection from you, or you may have gotten it from your partner.  Have your sex partner get examined and treated if necessary.  Practice safe, informed, and protected sex.  See your health care provider for other STI testing. SEEK MEDICAL CARE IF:   You still have symptoms after you finish your medicine.  You develop abdominal pain.  You have pain when you urinate.  You have bleeding after sexual intercourse.  You develop a rash.  Your medicine makes you sick or makes you throw up (vomit). MAKE SURE YOU:  Understand these instructions.  Will watch your condition.  Will get help right away if you are not doing well or get worse.   This information is not intended to replace advice given to you by your health care provider. Make sure you discuss any questions you have with your health care provider.   Document Released: 06/05/2001 Document Revised: 12/31/2014 Document Reviewed: 09/21/2013 Elsevier Interactive Patient Education [employer]2016 Elsevier Inc.

## 2016-04-20 NOTE — ED Notes (Signed)
E-signature not working. Patient signed printed copy.

## 2016-04-20 NOTE — ED Notes (Signed)
Patient was seen on Wednesday and diagnosed with pyelonephritis. States flank pain has worsened despite taking prescribed medications. +Nausea.

## 2016-04-20 NOTE — ED Provider Notes (Signed)
[facility] Emergency Department Provider Note        Time seen: ----------------------------------------- 10:05 AM on 04/20/2016 -----------------------------------------    I have reviewed the triage vital signs and the nursing notes.   HISTORY  Chief Complaint Flank Pain    HPI Alexis Francis is a 25 y.o. female who presents ER for flank pain. Patient states she was diagnosed 2 days ago with pyelonephritis. Patient states the flank pain has worsened despite taking medication, she vomited prior to arrival. Pain is in the right flank. Patient states she has a history of frequent UTIs and pyelonephritis. She denies any fevers.   History reviewed. No pertinent past medical history.  There are no active problems to display for this patient.   History reviewed. No pertinent past surgical history.  Allergies Review of patient's allergies indicates no known allergies.  Social History Social History  Substance Use Topics  . Smoking status: Current Every Day Smoker  . Smokeless tobacco: None  . Alcohol Use: No    Review of Systems Constitutional: Negative for fever. Eyes: Negative for visual changes. ENT: Negative for sore throat. Cardiovascular: Negative for chest pain. Respiratory: Negative for shortness of breath. Gastrointestinal: Positive right flank pain, vomiting Genitourinary: Negative for dysuria. Musculoskeletal: Negative for back pain. Skin: Negative for rash. Neurological: Negative for headaches, focal weakness or numbness.  10-point ROS otherwise negative.  ____________________________________________   PHYSICAL EXAM:  VITAL SIGNS: ED Triage Vitals  Enc Vitals Group     BP 04/20/16 0955 127/73 mmHg     Pulse Rate 04/20/16 0955 103     Resp 04/20/16 0955 18     Temp 04/20/16 0955 99.2 F (37.3 C)     Temp Source 04/20/16 0955 Oral     SpO2 04/20/16 0955 100 %     Weight 04/20/16 0955 250 lb (113.399 kg)     Height  04/20/16 0955  (1.651 m)     Head Cir --      Peak Flow --      Pain Score 04/20/16 0956 8     Pain Loc --      Pain Edu? --      Excl. in GC? --    Constitutional: Alert and oriented. Mild distress Eyes: Conjunctivae are normal. PERRL. Normal extraocular movements. ENT   Head: Normocephalic and atraumatic.   Nose: No congestion/rhinnorhea.   Mouth/Throat: Mucous membranes are moist.   Neck: No stridor. Cardiovascular: Normal rate, regular rhythm. No murmurs, rubs, or gallops. Respiratory: Normal respiratory effort without tachypnea nor retractions. Breath sounds are clear and equal bilaterally. No wheezes/rales/rhonchi. Gastrointestinal: Soft and nontender. Normal bowel sounds, right CVA tenderness Genitourinary: Copious white vaginal discharge Musculoskeletal: Nontender with normal range of motion in all extremities. No lower extremity tenderness nor edema. Neurologic:  Normal speech and language. No gross focal neurologic deficits are appreciated.  Skin:  Skin is warm, dry and intact. No rash noted. Psychiatric: Mood and affect are normal. Speech and behavior are normal.  ____________________________________________  ED COURSE:  Pertinent labs & imaging results that were available during my care of the patient were reviewed by me and considered in my medical decision making (see chart for details). Patient is in mild distress, I will assess for possible renal colic. Obtain CT imaging and basic labs. ____________________________________________    LABS (pertinent positives/negatives)  Labs Reviewed  WET PREP, GENITAL - Abnormal; Notable for the following:    Trich, Wet Prep PRESENT (*)  WBC, Wet Prep HPF POC FEW (*)    All other components within normal limits  COMPREHENSIVE METABOLIC PANEL - Abnormal; Notable for the following:    Creatinine, Ser 1.10 (*)    All other components within normal limits  URINALYSIS COMPLETEWITH MICROSCOPIC ([facility] ONLY) -  Abnormal; Notable for the following:    Color, Urine AMBER (*)    APPearance CLEAR (*)    Ketones, ur TRACE (*)    Nitrite POSITIVE (*)    Squamous Epithelial / LPF 6-30 (*)    All other components within normal limits  CHLAMYDIA/NGC RT PCR ([facility] ONLY)  CBC WITH DIFFERENTIAL/PLATELET    RADIOLOGY Images were viewed by me  CT renal protocol IMPRESSION: 4 cm left ovarian cysts.  No evidence of renal calculi or urinary tract obstruction.  No acute abnormality to correspond with patient's symptomatology is noted. ____________________________________________  FINAL ASSESSMENT AND PLAN  Flank pain, Trichomonas  Plan: Patient with labs and imaging as dictated above. Patient may be having referred pain due to Trichomonas and PID. She'll be discharged with Flagyl, and encouraged to continue taking antibiotics as previously prescribed for UTI.   Emily FilbertWilliams, Juaquin Ludington E, MD   Note: This dictation was prepared with Dragon dictation. Any transcriptional errors that result from this process are unintentional   Emily FilbertJonathan E Annamay Laymon, MD 04/20/16 1324

## 2016-05-08 ENCOUNTER — Encounter: Payer: Self-pay | Admitting: Emergency Medicine

## 2016-05-08 ENCOUNTER — Emergency Department
Admission: EM | Admit: 2016-05-08 | Discharge: 2016-05-08 | Disposition: A | Discharge status: Home / Self Care | Payer: No Typology Code available for payment source | Attending: Emergency Medicine | Admitting: Emergency Medicine

## 2016-05-08 DIAGNOSIS — R350 Frequency of micturition: Secondary | ICD-10-CM | POA: Diagnosis present

## 2016-05-08 DIAGNOSIS — Z791 Long term (current) use of non-steroidal anti-inflammatories (NSAID): Secondary | ICD-10-CM | POA: Insufficient documentation

## 2016-05-08 DIAGNOSIS — Z792 Long term (current) use of antibiotics: Secondary | ICD-10-CM | POA: Insufficient documentation

## 2016-05-08 DIAGNOSIS — Z79899 Other long term (current) drug therapy: Secondary | ICD-10-CM | POA: Diagnosis not present

## 2016-05-08 DIAGNOSIS — F17200 Nicotine dependence, unspecified, uncomplicated: Secondary | ICD-10-CM | POA: Insufficient documentation

## 2016-05-08 LAB — URINALYSIS COMPLETE WITH MICROSCOPIC (ARMC ONLY)
BACTERIA UA: NONE SEEN
BILIRUBIN URINE: NEGATIVE
GLUCOSE, UA: NEGATIVE mg/dL
Ketones, ur: NEGATIVE mg/dL
Nitrite: NEGATIVE
Protein, ur: NEGATIVE mg/dL
Specific Gravity, Urine: 1.018 (ref 1.005–1.030)
pH: 5 (ref 5.0–8.0)

## 2016-05-08 LAB — PREGNANCY, URINE: Preg Test, Ur: NEGATIVE

## 2016-05-08 NOTE — ED Notes (Signed)
Reports urinary frequency and hesitation. Also dark.  Reports recently treated for uti and seems the same.

## 2016-05-08 NOTE — ED Provider Notes (Signed)
[facility] Emergency Department Provider Note  ____________________________________________  Time seen: Approximately 9:25 AM  I have reviewed the triage vital signs and the nursing notes.   HISTORY  Chief Complaint Urinary Frequency    HPI Alexis Francis is a 25 y.o. female, NAD, presents to the emergency department with complaint of dark urine and increased urinary frequency over the last few days. Patient has been seen in this emergency department 2 times in the past 2-3 weeks for similar symptoms. States symptoms were resolving but have returned over the last week. Noted increased urinary frequency last night. Has not noted any hematuria nor complaints of any dysuria. Denies abdominal or lower back pain. Has not had any fevers, chills, body aches. States that she was given a prescription for Flagyl at her last visit but has not been able to start that medicine until this week. Patient did have a CT completed at her last visit which showed ovarian cyst but no other abnormalities. Patient notes she does not have a primary care provider nor has sought follow-up with OB/GYN since her last visit.   History reviewed. No pertinent past medical history.  There are no active problems to display for this patient.   History reviewed. No pertinent past surgical history.  Current Outpatient Rx  Name  Route  Sig  Dispense  Refill  . brompheniramine-pseudoephedrine-DM 30-2-10 MG/5ML syrup   Oral   Take 5 mLs by mouth 4 (four) times daily as needed.   120 mL   0   . metroNIDAZOLE (FLAGYL) 500 MG tablet   Oral   Take 1 tablet (500 mg total) by mouth 2 (two) times daily.   14 tablet   0   . naproxen (NAPROSYN) 500 MG tablet   Oral   Take 1 tablet (500 mg total) by mouth 2 (two) times daily with a meal.   60 tablet   0   . phenazopyridine (PYRIDIUM) 200 MG tablet   Oral   Take 1 tablet (200 mg total) by mouth 3 (three) times daily as needed for pain.   9  tablet   0   . sulfamethoxazole-trimethoprim (BACTRIM DS,SEPTRA DS) 800-160 MG tablet   Oral   Take 1 tablet by mouth 2 (two) times daily.   10 tablet   0   . traMADol (ULTRAM) 50 MG tablet   Oral   Take 1 tablet (50 mg total) by mouth every 6 (six) hours as needed.   20 tablet   0     Allergies Review of patient's allergies indicates no known allergies.  No family history on file.  Social History Social History  Substance Use Topics  . Smoking status: Current Every Day Smoker  . Smokeless tobacco: None  . Alcohol Use: No     Review of Systems  Constitutional: No fever/chills, fatigue Cardiovascular: No chest pain. Respiratory: No shortness of breath.  Gastrointestinal: No abdominal pain.  No nausea, vomiting.   Genitourinary: Positive dark urine, increased urinary frequency. Negative for dysuria. No hematuria. No urinary hesitancy, urgency. Musculoskeletal: Negative for back pain.  Skin: Negative for rash, skin sores. Neurological: Negative for headaches, focal weakness or numbness. 10-point ROS otherwise negative.  ____________________________________________   PHYSICAL EXAM:  VITAL SIGNS: ED Triage Vitals  Enc Vitals Group     BP 05/08/16 0909 118/73 mmHg     Pulse Rate 05/08/16 0909 74     Resp 05/08/16 0909 16     Temp 05/08/16 0909 98 F (  36.7 C)     Temp Source 05/08/16 0909 Oral     SpO2 05/08/16 0909 98 %     Weight 05/08/16 0909 250 lb (113.399 kg)     Height 05/08/16 0909  (1.651 m)     Head Cir --      Peak Flow --      Pain Score --      Pain Loc --      Pain Edu? --      Excl. in GC? --      Constitutional: Alert and oriented. Well appearing and in no acute distress. Eyes: Conjunctivae are normal.   Head: Atraumatic. Neck: Supple with full range of motion. Hematological/Lymphatic/Immunilogical: No cervical lymphadenopathy. Cardiovascular: Normal rate, regular rhythm. Normal S1 and S2.  Good peripheral  circulation. Respiratory: Normal respiratory effort without tachypnea or retractions. Lungs CTAB with breath sounds noted in all lung fields. Gastrointestinal: Soft and nontender without distention or guarding in all quadrants. No CVA tenderness. Musculoskeletal: No lower extremity tenderness nor edema.  No joint effusions. Neurologic:  Normal speech and language. No gross focal neurologic deficits are appreciated.  Skin:  Skin is warm, dry and intact. No rash noted. Psychiatric: Mood and affect are normal. Speech and behavior are normal. Patient exhibits appropriate insight and judgement.   ____________________________________________   LABS (all labs ordered are listed, but only abnormal results are displayed)  Labs Reviewed  URINALYSIS COMPLETEWITH MICROSCOPIC ([facility] ONLY) - Abnormal; Notable for the following:    Color, Urine YELLOW (*)    APPearance CLEAR (*)    Hgb urine dipstick 1+ (*)    Leukocytes, UA TRACE (*)    Squamous Epithelial / LPF 0-5 (*)    All other components within normal limits  URINE CULTURE  PREGNANCY, URINE   ____________________________________________  EKG  None ____________________________________________  RADIOLOGY  None ____________________________________________    PROCEDURES  Procedure(s) performed: None    Medications - No data to display   ____________________________________________   INITIAL IMPRESSION / ASSESSMENT AND PLAN / ED COURSE  Pertinent lab results that were available during my care of the patient were reviewed by me and considered in my medical decision making (see chart for details).  Patient's diagnosis is consistent with increased urinary frequency. Patient's physical exam and lab work is reassuring for no infection at this time. Patient advised to increased intake of water and decrease intake of sugar rare caffeinated beverages. We will complete standard urine culture to evaluate further for any bacteria that  may not have been present on urinalysis and call the patient with results when available. Patient is advised to establish care with [facility] clinic for primary care and with Dr. Feliberto Gottron in OB/GYN for reevaluation and follow-up of recurrent urinary symptoms. Patient is given ED precautions to return to the ED for any worsening or new symptoms.      ____________________________________________  FINAL CLINICAL IMPRESSION(S) / ED DIAGNOSES  Final diagnoses:  Increased urinary frequency      NEW MEDICATIONS STARTED DURING THIS VISIT:  Discharge Medication List as of 05/08/2016 10:03 AM           Hope Pigeon, PA-C 05/08/16 1037

## 2016-05-08 NOTE — ED Notes (Signed)
States she has a hx of uti/kidney infections  Sx's returned this week  Urine is dark and developed some urinary freq last pm  No fever or chills

## 2016-05-08 NOTE — Discharge Instructions (Signed)
Urinary Frequency °The number of times a normal person urinates depends upon how much liquid they take in and how much liquid they are losing. If the temperature is hot and there is high humidity, then the person will sweat more and usually breathe a little more frequently. These factors decrease the amount of frequency of urination that would be considered normal. °The amount you drink is easily determined, but the amount of fluid lost is sometimes more difficult to calculate.  °Fluid is lost in two ways: °· Sensible fluid loss is usually measured by the amount of urine that you get rid of. Losses of fluid can also occur with diarrhea. °· Insensible fluid loss is more difficult to measure. It is caused by evaporation. Insensible loss of fluid occurs through breathing and sweating. It usually ranges from a little less than a quart to a little more than a quart of fluid a day. °In normal temperatures and activity levels, the average person may urinate 4 to 7 times in a 24-hour period. Needing to urinate more often than that could indicate a problem. If one urinates 4 to 7 times in 24 hours and has large volumes each time, that could indicate a different problem from one who urinates 4 to 7 times a day and has small volumes. The time of urinating is also important. Most urinating should be done during the waking hours. Getting up at night to urinate frequently can indicate some problems. °CAUSES  °The bladder is the organ in your lower abdomen that holds urine. Like a balloon, it swells some as it fills up. Your nerves sense this and tell you it is time to head for the bathroom. There are a number of reasons that you might feel the need to urinate more often than usual. They include: °· Urinary tract infection. This is usually associated with other signs such as burning when you urinate. °· In men, problems with the prostate (a walnut-size gland that is located near the tube that carries urine out of your body). There  are two reasons why the prostate can cause an increased frequency of urination: °¨ An enlarged prostate that does not let the bladder empty well. If the bladder only half empties when you urinate, then it only has half the capacity to fill before you have to urinate again. °¨ The nerves in the bladder become more hypersensitive with an increased size of the prostate even if the bladder empties completely. °· Pregnancy. °· Obesity. Excess weight is more likely to cause a problem for women than for men. °· Bladder stones or other bladder problems. °· Caffeine. °· Alcohol. °· Medications. For example, drugs that help the body get rid of extra fluid (diuretics) increase urine production. Some other medicines must be taken with lots of fluids. °· Muscle or nerve weakness. This might be the result of a spinal cord injury, a stroke, multiple sclerosis, or Parkinson disease. °· Long-standing diabetes can decrease the sensation of the bladder. This loss of sensation makes it harder to sense the bladder needs to be emptied. Over a period of years, the bladder is stretched out by constant overfilling. This weakens the bladder muscles so that the bladder does not empty well and has less capacity to fill with new urine. °· Interstitial cystitis (also called painful bladder syndrome). This condition develops because the tissues that line the inside of the bladder are inflamed (inflammation is the body's way of reacting to injury or infection). It causes pain and frequent   urination. It occurs in women more often than in men. °DIAGNOSIS  °· To decide what might be causing your urinary frequency, your health care provider will probably: °¨ Ask about symptoms you have noticed. °¨ Ask about your overall health. This will include questions about any medications you are taking. °¨ Do a physical examination. °· Order some tests. These might include: °¨ A blood test to check for diabetes or other health issues that could be contributing  to the problem. °¨ Urine testing. This could measure the flow of urine and the pressure on the bladder. °¨ A test of your neurological system (the brain, spinal cord, and nerves). This is the system that senses the need to urinate. °¨ A bladder test to check whether it is emptying completely when you urinate. °¨ Cystoscopy. This test uses a thin tube with a tiny camera on it. It offers a look inside your urethra and bladder to see if there are problems. °¨ Imaging tests. You might be given a contrast dye and then asked to urinate. X-rays are taken to see how your bladder is working. °TREATMENT  °It is important for you to be evaluated to determine if the amount or frequency that you have is unusual or abnormal. If it is found to be abnormal, the cause should be determined and this can usually be found out easily. Depending upon the cause, treatment could include medication, stimulation of the nerves, or surgery. °There are not too many things that you can do as an individual to change your urinary frequency. It is important that you balance the amount of fluid intake needed to compensate for your activity and the temperature. Medical problems will be diagnosed and taken care of by your physician. There is no particular bladder training such as Kegel exercises that you can do to help urinary frequency. This is an exercise that is usually recommended for people who have leaking of urine when they laugh, cough, or sneeze. °HOME CARE INSTRUCTIONS  °· Take any medications your health care provider prescribed or suggested. Follow the directions carefully. °· Practice any lifestyle changes that are recommended. These might include: °¨ Drinking less fluid or drinking at different times of the day. If you need to urinate often during the night, for example, you may need to stop drinking fluids early in the evening. °¨ Cutting down on caffeine or alcohol. They both can make you need to urinate more often than normal. Caffeine  is found in coffee, tea, and sodas. °¨ Losing weight, if that is recommended. °· Keep a journal or a log. You might be asked to record how much you drink and when and where you feel the need to urinate. This will also help evaluate how well the treatment provided by your physician is working. °SEEK MEDICAL CARE IF:  °· Your need to urinate often gets worse. °· You feel increased pain or irritation when you urinate. °· You notice blood in your urine. °· You have questions about any medications that your health care provider recommended. °· You notice blood, pus, or swelling at the site of any test or treatment procedure. °· You develop a fever of more than 100.5°F (38.1°C). °SEEK IMMEDIATE MEDICAL CARE IF:  °You develop a fever of more than 102.0°F (38.9°C). °  °This information is not intended to replace advice given to you by your health care provider. Make sure you discuss any questions you have with your health care provider. °  °Document Released: 10/06/2009 Document Revised:   12/31/2014 Document Reviewed: 10/06/2009 °Elsevier Interactive Patient Education ©2016 [employer] ° °

## 2016-05-09 LAB — URINE CULTURE

## 2016-06-19 ENCOUNTER — Emergency Department
Admission: EM | Admit: 2016-06-19 | Discharge: 2016-06-19 | Disposition: A | Discharge status: Home / Self Care | Payer: No Typology Code available for payment source | Attending: Emergency Medicine | Admitting: Emergency Medicine

## 2016-06-19 DIAGNOSIS — X500XXA Overexertion from strenuous movement or load, initial encounter: Secondary | ICD-10-CM | POA: Insufficient documentation

## 2016-06-19 DIAGNOSIS — Y929 Unspecified place or not applicable: Secondary | ICD-10-CM | POA: Insufficient documentation

## 2016-06-19 DIAGNOSIS — Y990 Civilian activity done for income or pay: Secondary | ICD-10-CM | POA: Insufficient documentation

## 2016-06-19 DIAGNOSIS — S39012A Strain of muscle, fascia and tendon of lower back, initial encounter: Secondary | ICD-10-CM

## 2016-06-19 DIAGNOSIS — F17200 Nicotine dependence, unspecified, uncomplicated: Secondary | ICD-10-CM | POA: Insufficient documentation

## 2016-06-19 DIAGNOSIS — Y9389 Activity, other specified: Secondary | ICD-10-CM | POA: Insufficient documentation

## 2016-06-19 LAB — URINALYSIS COMPLETE WITH MICROSCOPIC (ARMC ONLY)
BACTERIA UA: NONE SEEN
Bilirubin Urine: NEGATIVE
GLUCOSE, UA: NEGATIVE mg/dL
HGB URINE DIPSTICK: NEGATIVE
Ketones, ur: NEGATIVE mg/dL
LEUKOCYTES UA: NEGATIVE
NITRITE: NEGATIVE
PROTEIN: NEGATIVE mg/dL
SPECIFIC GRAVITY, URINE: 1.02 (ref 1.005–1.030)
pH: 5 (ref 5.0–8.0)

## 2016-06-19 LAB — POCT PREGNANCY, URINE: PREG TEST UR: NEGATIVE

## 2016-06-19 MED ORDER — IBUPROFEN 800 MG PO TABS: 800.0000 mg | ORAL_TABLET | Freq: Three times a day (TID) | ORAL | Status: DC | PRN

## 2016-06-19 MED ORDER — CYCLOBENZAPRINE HCL 10 MG PO TABS: 10.0000 mg | ORAL_TABLET | Freq: Three times a day (TID) | ORAL | Status: DC | PRN
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
## 2016-06-19 NOTE — Discharge Instructions (Signed)
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
## 2016-06-19 NOTE — ED Notes (Signed)
Lower back pain for several days  denies any injury and states pain sometimes radiates into hips  Ambulates well to treatment area

## 2016-08-10 ENCOUNTER — Emergency Department
Admission: EM | Admit: 2016-08-10 | Discharge: 2016-08-10 | Disposition: A | Discharge status: Home / Self Care | Payer: No Typology Code available for payment source | Attending: Emergency Medicine | Admitting: Emergency Medicine

## 2016-08-10 ENCOUNTER — Encounter: Payer: Self-pay | Admitting: Emergency Medicine

## 2016-08-10 DIAGNOSIS — M545 Low back pain, unspecified: Secondary | ICD-10-CM

## 2016-08-10 DIAGNOSIS — M549 Dorsalgia, unspecified: Secondary | ICD-10-CM

## 2016-08-10 DIAGNOSIS — G8929 Other chronic pain: Secondary | ICD-10-CM | POA: Insufficient documentation

## 2016-08-10 DIAGNOSIS — F17200 Nicotine dependence, unspecified, uncomplicated: Secondary | ICD-10-CM | POA: Insufficient documentation

## 2016-08-10 MED ORDER — METHOCARBAMOL 500 MG PO TABS: 500.0000 mg | ORAL_TABLET | Freq: Four times a day (QID) | ORAL | 0 refills | Status: DC

## 2016-08-10 MED ORDER — MELOXICAM 15 MG PO TABS: 15.0000 mg | ORAL_TABLET | Freq: Every day | ORAL | 0 refills | Status: DC

## 2016-08-10 NOTE — ED Triage Notes (Signed)
Pt c/o lower back pain for years but worse for past 2 weeks. Worse with movement.

## 2016-08-10 NOTE — ED Provider Notes (Signed)
[facility] Emergency Department Provider Note  ____________________________________________  Time seen: Approximately 9:58 AM  I have reviewed the triage vital signs and the nursing notes.   HISTORY  Chief Complaint Back Pain    HPI Alexis Francis is a 25 y.o. female who presents to emergency department complaining of lower back pain. Patient states that she has a chronic history of lower back pain and is having worsening over the last 2 weeks. Patient states that she takes Tylenol and symptoms improved but then will return. Patient denies any recent injury. She denies any radiation of her pain. Patient reports that the pain is more of a "tight" sensation. She denies any urinary symptoms. She denies any abdominal pain, constipation, diarrhea. No other complaints at this time. Pain is mild to moderate.   History reviewed. No pertinent past medical history.  There are no active problems to display for this patient.   History reviewed. No pertinent surgical history.  Prior to Admission medications   Medication Sig Start Date End Date Taking? Authorizing Provider  meloxicam (MOBIC) 15 MG tablet Take 1 tablet (15 mg total) by mouth daily. 08/10/16   Delorise RoyalsJonathan D Cuthriell, PA-C  methocarbamol (ROBAXIN) 500 MG tablet Take 1 tablet (500 mg total) by mouth 4 (four) times daily. 08/10/16   Delorise RoyalsJonathan D Cuthriell, PA-C    Allergies Review of patient's allergies indicates no known allergies.  No family history on file.  Social History Social History  Substance Use Topics  . Smoking status: Current Every Day Smoker  . Smokeless tobacco: Not on file  . Alcohol use No     Review of Systems  Constitutional: No fever/chills Cardiovascular: no chest pain. Respiratory: no cough. No SOB. Gastrointestinal: No abdominal pain.  No nausea, no vomiting.  No diarrhea.  No constipation. Genitourinary: Negative for dysuria. No hematuria Musculoskeletal: Positive for lower  back pain Skin: Negative for rash, abrasions, lacerations, ecchymosis. Neurological: Negative for headaches, focal weakness or numbness. 10-point ROS otherwise negative.  ____________________________________________   PHYSICAL EXAM:  VITAL SIGNS: ED Triage Vitals  Enc Vitals Group     BP 08/10/16 0943 129/71     Pulse Rate 08/10/16 0943 90     Resp 08/10/16 0943 18     Temp 08/10/16 0943 98.8 F (37.1 C)     Temp Source 08/10/16 0943 Oral     SpO2 08/10/16 0943 100 %     Weight 08/10/16 0944 241 lb (109.3 kg)     Height 08/10/16 0944 5\' 5"  (1.651 m)     Head Circumference --      Peak Flow --      Pain Score 08/10/16 0944 7     Pain Loc --      Pain Edu? --      Excl. in GC? --      Constitutional: Alert and oriented. Well appearing and in no acute distress. Eyes: Conjunctivae are normal. PERRL. EOMI. Head: Atraumatic. Neck: No stridor.  No cervical spine tenderness to palpation.  Cardiovascular: Normal rate, regular rhythm. Normal S1 and S2.  Good peripheral circulation. Respiratory: Normal respiratory effort without tachypnea or retractions. Lungs CTAB. Good air entry to the bases with no decreased or absent breath sounds. Gastrointestinal: Bowel sounds 4 quadrants. Soft and nontender to palpation. No guarding or rigidity. No palpable masses. No distention. No CVA tenderness. Musculoskeletal: Full range of motion to all extremities. No gross deformities appreciated.No deformities noted to spine upon inspection. Full range of motion to  spine. Patient is nontender to palpation over the osseous structures of the spine. No tenderness to palpation over the lumbar paraspinal muscle groups. No tenderness to palpation of bilateral sciatic notches. Negative straight leg raise bilaterally. Dorsalis pedis pulse intact bilaterally. Sensation intact and equal lower extremities. Neurologic:  Normal speech and language. No gross focal neurologic deficits are appreciated.  Skin:  Skin is  warm, dry and intact. No rash noted. Psychiatric: Mood and affect are normal. Speech and behavior are normal. Patient exhibits appropriate insight and judgement.   ____________________________________________   LABS (all labs ordered are listed, but only abnormal results are displayed)  Labs Reviewed - No data to display ____________________________________________  EKG   ____________________________________________  RADIOLOGY   No results found.  ____________________________________________    PROCEDURES  Procedure(s) performed:    Procedures    Medications - No data to display   ____________________________________________   INITIAL IMPRESSION / ASSESSMENT AND PLAN / ED COURSE  Pertinent labs & imaging results that were available during my care of the patient were reviewed by me and considered in my medical decision making (see chart for details).  Clinical Course    Patient's diagnosis is consistent with Acute on chronic back pain. At this time patient's exam is reassuring with no tenderness to palpation, no concerning symptoms of bowel or bladder dysfunction, so anesthesia or procedures. As such, no imaging is warranted at this time.. Patient will be discharged home with prescriptions for anti-inflammatories and muscle relaxer. Patient is to follow up with primary care provider as needed or otherwise directed. Patient is given ED precautions to return to the ED for any worsening or new symptoms.     ____________________________________________  FINAL CLINICAL IMPRESSION(S) / ED DIAGNOSES  Final diagnoses:  Chronic back pain  Midline low back pain without sciatica      NEW MEDICATIONS STARTED DURING THIS VISIT:  New Prescriptions   MELOXICAM (MOBIC) 15 MG TABLET    Take 1 tablet (15 mg total) by mouth daily.   METHOCARBAMOL (ROBAXIN) 500 MG TABLET    Take 1 tablet (500 mg total) by mouth 4 (four) times daily.        This chart was  dictated using voice recognition software/Dragon. Despite best efforts to proofread, errors can occur which can change the meaning. Any change was purely unintentional.    Racheal PatchesJonathan D Cuthriell, PA-C 08/10/16 1031    Minna AntisKevin Paduchowski, MD 08/10/16 [phone]

## 2016-09-03 ENCOUNTER — Emergency Department
Admission: EM | Admit: 2016-09-03 | Discharge: 2016-09-03 | Disposition: A | Discharge status: Home / Self Care | Payer: No Typology Code available for payment source | Attending: Student in an Organized Health Care Education/Training Program | Admitting: Student in an Organized Health Care Education/Training Program

## 2016-09-03 ENCOUNTER — Encounter: Payer: Self-pay | Admitting: Emergency Medicine

## 2016-09-03 DIAGNOSIS — Z791 Long term (current) use of non-steroidal anti-inflammatories (NSAID): Secondary | ICD-10-CM | POA: Diagnosis not present

## 2016-09-03 DIAGNOSIS — J069 Acute upper respiratory infection, unspecified: Secondary | ICD-10-CM | POA: Diagnosis not present

## 2016-09-03 DIAGNOSIS — R05 Cough: Secondary | ICD-10-CM | POA: Diagnosis present

## 2016-09-03 DIAGNOSIS — F17200 Nicotine dependence, unspecified, uncomplicated: Secondary | ICD-10-CM | POA: Insufficient documentation

## 2016-09-03 MED ORDER — IBUPROFEN 600 MG PO TABS: 600.0000 mg | ORAL_TABLET | Freq: Three times a day (TID) | ORAL | 0 refills | Status: DC | PRN

## 2016-09-03 MED ORDER — PSEUDOEPH-BROMPHEN-DM 30-2-10 MG/5ML PO SYRP: 5.0000 mL | ORAL_SOLUTION | Freq: Four times a day (QID) | ORAL | 0 refills | Status: DC | PRN

## 2016-09-03 NOTE — ED Triage Notes (Signed)
States she developed some nasal congestion and prod cough several days ago   Unsure of fever positive chills

## 2016-09-03 NOTE — ED Notes (Signed)
Discussed follow-up with [facility].  Discussed no need for antibiotics due to URI caused by virus.  Patient verbalized understanding.

## 2016-09-03 NOTE — ED Provider Notes (Signed)
[facility] Emergency Department Provider Note   ____________________________________________   None    (approximate)  I have reviewed the triage vital signs and the nursing notes.   HISTORY  Chief Complaint Cough and Nasal Congestion    HPI MADGIE DHALIWAL is a 25 y.o. female patient complaining of 3 days of nasal congestion and productive cough. Patient states she is unsure of fever but does have chills. Patient denies any nausea vomiting diarrhea. Patient states she had a sore throat which has resolved. No palliative measures taken for this complaint. Patient rates her overall pain discomfort as a 3/10. Patient described her pain as "achy".   History reviewed. No pertinent past medical history.  There are no active problems to display for this patient.   History reviewed. No pertinent surgical history.  Prior to Admission medications   Medication Sig Start Date End Date Taking? Authorizing Provider  brompheniramine-pseudoephedrine-DM 30-2-10 MG/5ML syrup Take 5 mLs by mouth 4 (four) times daily as needed. 09/03/16   Joni Reining, PA-C  ibuprofen (ADVIL,MOTRIN) 600 MG tablet Take 1 tablet (600 mg total) by mouth every 8 (eight) hours as needed. 09/03/16   Joni Reining, PA-C  meloxicam (MOBIC) 15 MG tablet Take 1 tablet (15 mg total) by mouth daily. 08/10/16   Delorise Royals Cuthriell, PA-C  methocarbamol (ROBAXIN) 500 MG tablet Take 1 tablet (500 mg total) by mouth 4 (four) times daily. 08/10/16   Delorise Royals Cuthriell, PA-C    Allergies Review of patient's allergies indicates no known allergies.  No family history on file.  Social History Social History  Substance Use Topics  . Smoking status: Current Every Day Smoker  . Smokeless tobacco: Never Used  . Alcohol use No    Review of Systems Constitutional: No fever/chills Eyes: No visual changes. ENT: No sore throat. Nasal congestion Cardiovascular: Denies chest pain. Respiratory: Denies  shortness of breath. Nonproductive cough Gastrointestinal: No abdominal pain.  No nausea, no vomiting.  No diarrhea.  No constipation. Genitourinary: Negative for dysuria. Musculoskeletal: Negative for back pain. Skin: Negative for rash. Neurological: Negative for headaches, focal weakness or numbness.    ____________________________________________   PHYSICAL EXAM:  VITAL SIGNS: ED Triage Vitals  Enc Vitals Group     BP 09/03/16 1034 118/71     Pulse Rate 09/03/16 1034 76     Resp 09/03/16 1034 20     Temp 09/03/16 1034 98.9 F (37.2 C)     Temp Source 09/03/16 1034 Oral     SpO2 09/03/16 1034 99 %     Weight 09/03/16 1034 250 lb (113.4 kg)     Height 09/03/16 1034 5\' 5"  (1.651 m)     Head Circumference --      Peak Flow --      Pain Score 09/03/16 1035 3     Pain Loc --      Pain Edu? --      Excl. in GC? --     Constitutional: Alert and oriented. Well appearing and in no acute distress. Eyes: Conjunctivae are normal. PERRL. EOMI. Head: Atraumatic. Nose:Bilateral maxillary guarding. Bilateral edematous nasal turbinates. Clear rhinorrhea. Mouth/Throat: Mucous membranes are moist.  Oropharynx non-erythematous. Postnasal drainage. Neck: No stridor. No cervical spine tenderness to palpation. Hematological/Lymphatic/Immunilogical: No cervical lymphadenopathy. Cardiovascular: Normal rate, regular rhythm. Grossly normal heart sounds.  Good peripheral circulation. Respiratory: Normal respiratory effort.  No retractions. Lungs CTAB. Nonproductive cough. Gastrointestinal: Soft and nontender. No distention. No abdominal bruits. No CVA  tenderness. Musculoskeletal: No lower extremity tenderness nor edema.  No joint effusions. Neurologic:  Normal speech and language. No gross focal neurologic deficits are appreciated. No gait instability. Skin:  Skin is warm, dry and intact. No rash noted. Psychiatric: Mood and affect are normal. Speech and behavior are  normal.  ____________________________________________   LABS (all labs ordered are listed, but only abnormal results are displayed)  Labs Reviewed - No data to display ____________________________________________  EKG   ____________________________________________  RADIOLOGY   ____________________________________________   PROCEDURES  Procedure(s) performed: None  Procedures  Critical Care performed: No  ____________________________________________   INITIAL IMPRESSION / ASSESSMENT AND PLAN / ED COURSE  Pertinent labs & imaging results that were available during my care of the patient were reviewed by me and considered in my medical decision making (see chart for details).  Viral upper rest or infection. Patient given discharge care instructions. Patient given a prescription for Bromfed-DM and ibuprofen. Patient given a work note. Patient advised to follow-up with family doctor condition persists.  Clinical Course     ____________________________________________   FINAL CLINICAL IMPRESSION(S) / ED DIAGNOSES  Final diagnoses:  URI (upper respiratory infection)      NEW MEDICATIONS STARTED DURING THIS VISIT:  New Prescriptions   BROMPHENIRAMINE-PSEUDOEPHEDRINE-DM 30-2-10 MG/5ML SYRUP    Take 5 mLs by mouth 4 (four) times daily as needed.   IBUPROFEN (ADVIL,MOTRIN) 600 MG TABLET    Take 1 tablet (600 mg total) by mouth every 8 (eight) hours as needed.     Note:  This document was prepared using Dragon voice recognition software and may include unintentional dictation errors.    Joni ReiningRonald K Dallen Bunte, PA-C 09/03/16 1054    Willy EddyPatrick Robinson, MD 09/03/16 [phone]

## 2016-10-01 ENCOUNTER — Encounter: Payer: Self-pay | Admitting: Emergency Medicine

## 2016-10-01 ENCOUNTER — Emergency Department
Admission: EM | Admit: 2016-10-01 | Discharge: 2016-10-01 | Disposition: A | Discharge status: Home / Self Care | Payer: No Typology Code available for payment source | Attending: Emergency Medicine | Admitting: Emergency Medicine

## 2016-10-01 DIAGNOSIS — K529 Noninfective gastroenteritis and colitis, unspecified: Secondary | ICD-10-CM | POA: Diagnosis not present

## 2016-10-01 DIAGNOSIS — R197 Diarrhea, unspecified: Secondary | ICD-10-CM | POA: Diagnosis present

## 2016-10-01 DIAGNOSIS — F17200 Nicotine dependence, unspecified, uncomplicated: Secondary | ICD-10-CM | POA: Diagnosis not present

## 2016-10-01 LAB — COMPREHENSIVE METABOLIC PANEL
ALBUMIN: 3.8 g/dL (ref 3.5–5.0)
ALK PHOS: 45 U/L (ref 38–126)
ALT: 15 U/L (ref 14–54)
ANION GAP: 5 (ref 5–15)
AST: 18 U/L (ref 15–41)
BILIRUBIN TOTAL: 0.7 mg/dL (ref 0.3–1.2)
BUN: 11 mg/dL (ref 6–20)
CALCIUM: 8.9 mg/dL (ref 8.9–10.3)
CO2: 26 mmol/L (ref 22–32)
Chloride: 107 mmol/L (ref 101–111)
Creatinine, Ser: 0.92 mg/dL (ref 0.44–1.00)
GFR calc Af Amer: >60 mL/min (ref >60)
GFR calc non Af Amer: >60 mL/min (ref >60)
GLUCOSE: 107 mg/dL — AB (ref 65–99)
Potassium: 3.8 mmol/L (ref 3.5–5.1)
SODIUM: 138 mmol/L (ref 135–145)
TOTAL PROTEIN: 7 g/dL (ref 6.5–8.1)

## 2016-10-01 LAB — CBC
HCT: 37.6 % (ref 35.0–47.0)
HEMOGLOBIN: 13.2 g/dL (ref 12.0–16.0)
MCH: 33 pg (ref 26.0–34.0)
MCHC: 35.1 g/dL (ref 32.0–36.0)
MCV: 93.9 fL (ref 80.0–100.0)
Platelets: 252 10*3/uL (ref 150–440)
RBC: 4.01 MIL/uL (ref 3.80–5.20)
RDW: 13.2 % (ref 11.5–14.5)
WBC: 5.5 10*3/uL (ref 3.6–11.0)

## 2016-10-01 LAB — LIPASE, BLOOD: Lipase: 22 U/L (ref 11–51)

## 2016-10-01 MED ORDER — ONDANSETRON HCL 4 MG PO TABS: 4.0000 mg | ORAL_TABLET | Freq: Three times a day (TID) | ORAL | 0 refills | Status: DC | PRN

## 2016-10-01 MED ORDER — SODIUM CHLORIDE 0.9 % IV BOLUS (SEPSIS)
1000.0000 mL | Freq: Once | INTRAVENOUS | Status: AC
  Administered 2016-10-01: 1000 mL via INTRAVENOUS

## 2016-10-01 MED ORDER — ONDANSETRON HCL 4 MG/2ML IJ SOLN
4.0000 mg | Freq: Once | INTRAMUSCULAR | Status: AC | PRN
  Administered 2016-10-01: 4 mg via INTRAVENOUS
  Filled 2016-10-01: qty 2

## 2016-10-01 NOTE — ED Provider Notes (Signed)
Time Seen: Approximately 1000  I have reviewed the triage notes  Chief Complaint: Diarrhea and Emesis   History of Present Illness: Alexis Francis is a 25 y.o. female who presents with nausea, vomiting and diarrhea that started yesterday. Denies any melena or hematochezia. She denies any hematemesis or biliary emesis. She denies any focal abdominal pain. She is not aware of any foodborne exposure. No recent travel. She had antibiotics remotely as an outpatient treatment for chlamydia.  History reviewed. No pertinent past medical history.  There are no active problems to display for this patient.   History reviewed. No pertinent surgical history.  History reviewed. No pertinent surgical history.  Current Outpatient Rx  . Order #: [id number] Class: Print  . Order #: [id number] Class: Print  . Order #: [id number] Class: Print  . Order #: [id number] Class: Print  . Order #: [id number] Class: Print    Allergies:  Review of patient's allergies indicates no known allergies.  Family History: No family history on file.  Social History: Social History  Substance Use Topics  . Smoking status: Current Every Day Smoker  . Smokeless tobacco: Never Used  . Alcohol use No     Review of Systems:   10 point review of systems was performed and was otherwise negative:  Constitutional: No fever Eyes: No visual disturbances ENT: No sore throat, ear pain Cardiac: No chest pain Respiratory: No shortness of breath, wheezing, or stridor Abdomen: No abdominal pain currently. Nausea with no active vomiting. Loose watery stool without melena or hematochezia Endocrine: No weight loss, No night sweats Extremities: No peripheral edema, cyanosis Skin: No rashes, easy bruising Neurologic: No focal weakness, trouble with speech or swollowing Urologic: No dysuria, Hematuria, or urinary frequency   Physical Exam:  ED Triage Vitals  Enc Vitals Group     BP 10/01/16 0928 125/70     Pulse Rate  10/01/16 0928 (!) 105     Resp 10/01/16 0928 20     Temp 10/01/16 0928 98.4 F (36.9 C)     Temp Source 10/01/16 0928 Oral     SpO2 10/01/16 0928 99 %     Weight 10/01/16 0929 250 lb (113.4 kg)     Height 10/01/16 0929 5\' 5"  (1.651 m)     Head Circumference --      Peak Flow --      Pain Score 10/01/16 1249 0     Pain Loc --      Pain Edu? --      Excl. in GC? --     General: Awake , Alert , and Oriented times 3; GCS 15 . Anxious Head: Normal cephalic , atraumatic Eyes: Pupils equal , round, reactive to light Nose/Throat: No nasal drainage, patent upper airway without erythema or exudate.  Neck: Supple, Full range of motion, No anterior adenopathy or palpable thyroid masses Lungs: Clear to ascultation without wheezes , rhonchi, or rales Heart: Regular rate, regular rhythm without murmurs , gallops , or rubs Abdomen: Soft, nonfocal tender without rebound, guarding , or rigidity; bowel sounds positive and symmetric in all 4 quadrants. No organomegaly .        Extremities: 2 plus symmetric pulses. No edema, clubbing or cyanosis Neurologic: normal ambulation, Motor symmetric without deficits, sensory intact Skin: warm, dry, no rashes   Labs:   All laboratory work was reviewed including any pertinent negatives or positives listed below:  Labs Reviewed  COMPREHENSIVE METABOLIC PANEL - Abnormal; Notable for the following:  Result Value   Glucose, Bld 107 (*)    All other components within normal limits  LIPASE, BLOOD  CBC  Laboratory work was reviewed and showed no clinically significant abnormalities.   ED Course:  Patient's stay here showed symptomatic improvement with IV fluid bolus, anti-medic therapy. The patient does not appear to have an acute abdomen on exam. I felt this was unlikely to be acute appendicitis or acute cholecystitis. Given the nausea, vomiting and loose watery stool this most likely is gastroenteritis at this time. Patient was advised to return here if  she develops any above-mentioned symptoms. She was given a work note and a prescription for Zofran. She was advised to avoid over-the-counter antidiarrhea medications. Clinical Course     Assessment: * Gastroenteritis  Final Clinical Impression:   Final diagnoses:  Gastroenteritis     Plan:  Outpatient " Discharge Medication List as of 10/01/2016 12:40 PM    START taking these medications   Details  ondansetron (ZOFRAN) 4 MG tablet Take 1 tablet (4 mg total) by mouth every 8 (eight) hours as needed for nausea or vomiting., Starting Mon 10/01/2016, Print      " Patient was advised to return immediately if condition worsens. Patient was advised to follow up with their primary care physician or other specialized physicians involved in their outpatient care. The patient and/or family member/power of attorney had laboratory results reviewed at the bedside. All questions and concerns were addressed and appropriate discharge instructions were distributed by the nursing staff.           Jennye MoccasinBrian S Quigley, MD 10/01/16 [id number]

## 2016-10-01 NOTE — ED Triage Notes (Signed)
Pt c/o n/v/d started yesterday, denies any other sx.

## 2016-10-01 NOTE — Discharge Instructions (Signed)
Please return immediately if condition worsens. Please contact her primary physician or the physician you were given for referral. If you have any specialist physicians involved in her treatment and plan please also contact them. Thank you for using [facility]. ° °

## 2017-03-18 ENCOUNTER — Encounter: Payer: Self-pay | Admitting: *Deleted

## 2017-03-18 ENCOUNTER — Emergency Department
Admission: EM | Admit: 2017-03-18 | Discharge: 2017-03-18 | Disposition: A | Discharge status: Home / Self Care | Payer: Self-pay | Attending: Emergency Medicine | Admitting: Emergency Medicine

## 2017-03-18 DIAGNOSIS — B349 Viral infection, unspecified: Secondary | ICD-10-CM | POA: Insufficient documentation

## 2017-03-18 DIAGNOSIS — F17200 Nicotine dependence, unspecified, uncomplicated: Secondary | ICD-10-CM | POA: Insufficient documentation

## 2017-03-18 LAB — URINALYSIS, COMPLETE (UACMP) WITH MICROSCOPIC
Bilirubin Urine: NEGATIVE
Glucose, UA: NEGATIVE mg/dL
HGB URINE DIPSTICK: NEGATIVE
Ketones, ur: NEGATIVE mg/dL
LEUKOCYTES UA: NEGATIVE
NITRITE: NEGATIVE
Protein, ur: NEGATIVE mg/dL
SPECIFIC GRAVITY, URINE: 1.02 (ref 1.005–1.030)
pH: 6 (ref 5.0–8.0)

## 2017-03-18 LAB — POCT PREGNANCY, URINE: PREG TEST UR: NEGATIVE

## 2017-03-18 LAB — INFLUENZA PANEL BY PCR (TYPE A & B)
Influenza A By PCR: NEGATIVE
Influenza B By PCR: NEGATIVE

## 2017-03-18 MED ORDER — BENZONATATE 100 MG PO CAPS: 200.0000 mg | ORAL_CAPSULE | Freq: Three times a day (TID) | ORAL | 0 refills | Status: DC | PRN

## 2017-03-18 MED ORDER — ONDANSETRON 4 MG PO TBDP: 4.0000 mg | ORAL_TABLET | Freq: Three times a day (TID) | ORAL | 0 refills | Status: DC | PRN

## 2017-03-18 MED ORDER — ONDANSETRON 4 MG PO TBDP
4.0000 mg | ORAL_TABLET | Freq: Once | ORAL | Status: AC
  Administered 2017-03-18: 4 mg via ORAL
  Filled 2017-03-18: qty 1

## 2017-03-18 NOTE — ED Notes (Signed)
See triage note   States she developed headache yesterday..low grade fever this am  States she took some tylenol pta. Also has had lower back pain for some time  Denies any urinary sx's

## 2017-03-18 NOTE — Discharge Instructions (Signed)
[facility] if any continued problems. Begin taking Tylenol as needed for fever. Zofran ODT as needed for nausea or vomiting. Tessalon Perles 1 or 2 every 8 hours as needed for cough.  Increase clear liquids.

## 2017-03-18 NOTE — ED Provider Notes (Signed)
[facility] Emergency Department Provider Note   ____________________________________________   First MD Initiated Contact with Patient 03/18/17 [phone]     (approximate)  I have reviewed the triage vital signs and the nursing notes.   HISTORY  Chief Complaint Fever; Cough; and Generalized Body Aches    HPI Alexis Francis is a 26 y.o. female is here with complaint of cough, congestion, body aches for the last 4 days.Patient states she vomited once this morning. She denies any diarrhea. Patient also complains of frequent urination but no dysuria. Patient is been taking Tylenol for her fever. Currently patient complains of nausea in the exam room. She is unaware of any exposure to the flu. Currently she rates her pain as a 7 out of 10.   History reviewed. No pertinent past medical history.  There are no active problems to display for this patient.   History reviewed. No pertinent surgical history.  Prior to Admission medications   Medication Sig Start Date End Date Taking? Authorizing Provider  benzonatate (TESSALON PERLES) 100 MG capsule Take 2 capsules (200 mg total) by mouth 3 (three) times daily as needed. 03/18/17 03/18/18  Tommi Rumpshonda L Summers, PA-C  ondansetron (ZOFRAN ODT) 4 MG disintegrating tablet Take 1 tablet (4 mg total) by mouth every 8 (eight) hours as needed for nausea or vomiting. 03/18/17   Tommi Rumpshonda L Summers, PA-C    Allergies Patient has no known allergies.  History reviewed. No pertinent family history.  Social History Social History  Substance Use Topics  . Smoking status: Current Every Day Smoker  . Smokeless tobacco: Never Used  . Alcohol use No    Review of Systems Constitutional: Subjective fever/chills Eyes: No visual changes. ENT: No sore throat. Cardiovascular: Denies chest pain. Respiratory: Denies shortness of breath. Positive nonproductive cough. Gastrointestinal: No abdominal pain.  Positive nausea, positive  vomiting.  No diarrhea.  No constipation. Genitourinary: Positive urinary frequency. Musculoskeletal: Negative for back pain. Skin: Negative for rash. Neurological: Negative for headaches, focal weakness or numbness.  10-point ROS otherwise negative.  ____________________________________________   PHYSICAL EXAM:  VITAL SIGNS: ED Triage Vitals  Enc Vitals Group     BP 03/18/17 0718 127/84     Pulse Rate 03/18/17 0718 (!) 105     Resp 03/18/17 0718 18     Temp 03/18/17 0718 99.8 F (37.7 C)     Temp Source 03/18/17 0718 Oral     SpO2 03/18/17 0718 100 %     Weight 03/18/17 0718 250 lb (113.4 kg)     Height 03/18/17 0718 5\' 5"  (1.651 m)     Head Circumference --      Peak Flow --      Pain Score 03/18/17 0719 7     Pain Loc --      Pain Edu? --      Excl. in GC? --     Constitutional: Alert and oriented. Well appearing and in no acute distress. Presently with nausea but no active vomiting. Eyes: Conjunctivae are normal. PERRL. EOMI. Head: Atraumatic. Nose: No congestion/rhinnorhea. Mouth/Throat: Mucous membranes are moist.  Oropharynx non-erythematous. Neck: No stridor.   Hematological/Lymphatic/Immunilogical: No cervical lymphadenopathy. Cardiovascular: Normal rate, regular rhythm. Grossly normal heart sounds.  Good peripheral circulation. Respiratory: Normal respiratory effort.  No retractions. Lungs CTAB. Gastrointestinal: Soft and nontender. No distention. Bowel sounds normoactive 4 quadrants. No CVA tenderness. Musculoskeletal: No lower extremity tenderness nor edema.  No joint effusions. Neurologic:  Normal speech and language. No  gross focal neurologic deficits are appreciated. No gait instability. Skin:  Skin is warm, dry and intact. No rash noted. Psychiatric: Mood and affect are normal. Speech and behavior are normal.  ____________________________________________   LABS (all labs ordered are listed, but only abnormal results are displayed)  Labs Reviewed    URINALYSIS, COMPLETE (UACMP) WITH MICROSCOPIC - Abnormal; Notable for the following:       Result Value   Color, Urine YELLOW (*)    APPearance HAZY (*)    Bacteria, UA RARE (*)    Squamous Epithelial / LPF 0-5 (*)    All other components within normal limits  INFLUENZA PANEL BY PCR (TYPE A & B)  POC URINE PREG, ED  POCT PREGNANCY, URINE   ____________________________________________  PROCEDURES  Procedure(s) performed: None  Procedures  Critical Care performed: No  ____________________________________________   INITIAL IMPRESSION / ASSESSMENT AND PLAN / ED COURSE  Pertinent labs & imaging results that were available during my care of the patient were reviewed by me and considered in my medical decision making (see chart for details).   Patient given Zofran ODT 4 mg while in the department. There was no continued nausea within 15-20 minutes. Patient was given a prescription for [facility] as needed for cough. She'll continue Tylenol at home for fever as needed. Patient requested a note to remain out of work today. Patient is to continue clear liquids. She was made aware that her urinalysis was negative for any infection. She'll follow-up with her primary care or [facility] if any continued problems.     ____________________________________________   FINAL CLINICAL IMPRESSION(S) / ED DIAGNOSES  Final diagnoses:  Viral syndrome      NEW MEDICATIONS STARTED DURING THIS VISIT:  Discharge Medication List as of 03/18/2017  8:48 AM    START taking these medications   Details  benzonatate (TESSALON PERLES) 100 MG capsule Take 2 capsules (200 mg total) by mouth 3 (three) times daily as needed., Starting Mon 03/18/2017, Until Tue 03/18/2018, Print    ondansetron (ZOFRAN ODT) 4 MG disintegrating tablet Take 1 tablet (4 mg total) by mouth every 8 (eight) hours as needed for nausea or vomiting., Starting Mon 03/18/2017, Print         Note:  This document was  prepared using Dragon voice recognition software and may include unintentional dictation errors.    Tommi Rumps, PA-C 03/18/17 1135    Myrna Blazer, MD 03/18/17 [phone]

## 2017-03-18 NOTE — ED Triage Notes (Signed)
States fever, cough and body aches since yesterday, headache as well, denies any urinary symptoms, states she took tylenol OTC

## 2017-05-02 ENCOUNTER — Inpatient Hospital Stay
Admission: EM | Admit: 2017-05-02 | Discharge: 2017-05-04 | DRG: 759 | Disposition: A | Discharge status: Home / Self Care | Payer: Self-pay | Attending: Obstetrics & Gynecology | Admitting: Obstetrics & Gynecology

## 2017-05-02 ENCOUNTER — Encounter: Payer: Self-pay | Admitting: Emergency Medicine

## 2017-05-02 ENCOUNTER — Emergency Department: Payer: Self-pay

## 2017-05-02 DIAGNOSIS — N739 Female pelvic inflammatory disease, unspecified: Secondary | ICD-10-CM | POA: Diagnosis present

## 2017-05-02 DIAGNOSIS — N730 Acute parametritis and pelvic cellulitis: Secondary | ICD-10-CM | POA: Diagnosis present

## 2017-05-02 DIAGNOSIS — F17200 Nicotine dependence, unspecified, uncomplicated: Secondary | ICD-10-CM | POA: Diagnosis present

## 2017-05-02 DIAGNOSIS — R102 Pelvic and perineal pain: Secondary | ICD-10-CM

## 2017-05-02 DIAGNOSIS — N7093 Salpingitis and oophoritis, unspecified: Principal | ICD-10-CM | POA: Diagnosis present

## 2017-05-02 DIAGNOSIS — N83202 Unspecified ovarian cyst, left side: Secondary | ICD-10-CM | POA: Diagnosis present

## 2017-05-02 LAB — CBC
HCT: 39 % (ref 35.0–47.0)
Hemoglobin: 13.2 g/dL (ref 12.0–16.0)
MCH: 31.5 pg (ref 26.0–34.0)
MCHC: 33.9 g/dL (ref 32.0–36.0)
MCV: 93 fL (ref 80.0–100.0)
PLATELETS: 288 10*3/uL (ref 150–440)
RBC: 4.19 MIL/uL (ref 3.80–5.20)
RDW: 12.7 % (ref 11.5–14.5)
WBC: 13.6 10*3/uL — ABNORMAL HIGH (ref 3.6–11.0)

## 2017-05-02 LAB — URINALYSIS, COMPLETE (UACMP) WITH MICROSCOPIC
Bilirubin Urine: NEGATIVE
Glucose, UA: NEGATIVE mg/dL
HGB URINE DIPSTICK: NEGATIVE
Ketones, ur: NEGATIVE mg/dL
Leukocytes, UA: NEGATIVE
NITRITE: NEGATIVE
PH: 8 (ref 5.0–8.0)
Protein, ur: 30 mg/dL — AB
Specific Gravity, Urine: 1.019 (ref 1.005–1.030)

## 2017-05-02 LAB — RAPID HIV SCREEN (HIV 1/2 AB+AG)
HIV 1/2 Antibodies: NONREACTIVE
HIV-1 P24 Antigen - HIV24: NONREACTIVE

## 2017-05-02 LAB — COMPREHENSIVE METABOLIC PANEL
ALK PHOS: 53 U/L (ref 38–126)
ALT: 13 U/L — AB (ref 14–54)
ANION GAP: 8 (ref 5–15)
AST: 26 U/L (ref 15–41)
Albumin: 4.5 g/dL (ref 3.5–5.0)
BILIRUBIN TOTAL: 1.2 mg/dL (ref 0.3–1.2)
BUN: 8 mg/dL (ref 6–20)
CALCIUM: 9.4 mg/dL (ref 8.9–10.3)
CO2: 23 mmol/L (ref 22–32)
CREATININE: 0.94 mg/dL (ref 0.44–1.00)
Chloride: 107 mmol/L (ref 101–111)
Glucose, Bld: 117 mg/dL — ABNORMAL HIGH (ref 65–99)
Potassium: 3.6 mmol/L (ref 3.5–5.1)
Sodium: 138 mmol/L (ref 135–145)
TOTAL PROTEIN: 8.1 g/dL (ref 6.5–8.1)

## 2017-05-02 LAB — POCT PREGNANCY, URINE: Preg Test, Ur: NEGATIVE

## 2017-05-02 LAB — WET PREP, GENITAL
CLUE CELLS WET PREP: NONE SEEN
SPERM: NONE SEEN
TRICH WET PREP: NONE SEEN
Yeast Wet Prep HPF POC: NONE SEEN

## 2017-05-02 LAB — CHLAMYDIA/NGC RT PCR (ARMC ONLY)
Chlamydia Tr: NOT DETECTED
N gonorrhoeae: NOT DETECTED

## 2017-05-02 LAB — LIPASE, BLOOD: Lipase: 28 U/L (ref 11–51)

## 2017-05-02 MED ORDER — MORPHINE SULFATE (PF) 4 MG/ML IV SOLN
4.0000 mg | Freq: Once | INTRAVENOUS | Status: AC
  Administered 2017-05-02: 4 mg via INTRAVENOUS

## 2017-05-02 MED ORDER — LACTATED RINGERS IV SOLN
INTRAVENOUS | Status: DC
  Administered 2017-05-03 – 2017-05-04 (×4): via INTRAVENOUS

## 2017-05-02 MED ORDER — MORPHINE SULFATE (PF) 2 MG/ML IV SOLN: 1.0000 mg | INTRAVENOUS | Status: DC | PRN

## 2017-05-02 MED ORDER — MORPHINE SULFATE (PF) 4 MG/ML IV SOLN
INTRAVENOUS | Status: AC
  Filled 2017-05-02: qty 1

## 2017-05-02 MED ORDER — DEXTROSE 5 % IV SOLN
2.0000 g | Freq: Once | INTRAVENOUS | Status: AC
  Administered 2017-05-02: 2 g via INTRAVENOUS
  Filled 2017-05-02: qty 2

## 2017-05-02 MED ORDER — TRAMADOL HCL 50 MG PO TABS: 50.0000 mg | ORAL_TABLET | Freq: Four times a day (QID) | ORAL | Status: DC | PRN

## 2017-05-02 MED ORDER — SODIUM CHLORIDE 0.9 % IV BOLUS (SEPSIS)
1000.0000 mL | Freq: Once | INTRAVENOUS | Status: AC
  Administered 2017-05-02: 1000 mL via INTRAVENOUS

## 2017-05-02 MED ORDER — ONDANSETRON HCL 4 MG/2ML IJ SOLN
4.0000 mg | Freq: Once | INTRAMUSCULAR | Status: AC
  Administered 2017-05-02: 4 mg via INTRAVENOUS

## 2017-05-02 MED ORDER — DOXYCYCLINE HYCLATE 100 MG IV SOLR
100.0000 mg | Freq: Two times a day (BID) | INTRAVENOUS | Status: DC
  Administered 2017-05-03 (×2): 100 mg via INTRAVENOUS
  Filled 2017-05-02 (×5): qty 100

## 2017-05-02 MED ORDER — ONDANSETRON HCL 4 MG/2ML IJ SOLN
INTRAMUSCULAR | Status: AC
  Filled 2017-05-02: qty 2

## 2017-05-02 MED ORDER — KETOROLAC TROMETHAMINE 30 MG/ML IJ SOLN
15.0000 mg | Freq: Four times a day (QID) | INTRAMUSCULAR | Status: DC
  Administered 2017-05-03 – 2017-05-04 (×6): 15 mg via INTRAVENOUS
  Filled 2017-05-02 (×6): qty 1

## 2017-05-02 MED ORDER — ONDANSETRON HCL 4 MG/2ML IJ SOLN: 4.0000 mg | Freq: Four times a day (QID) | INTRAMUSCULAR | Status: DC | PRN

## 2017-05-02 MED ORDER — CEFOXITIN SODIUM 2 G IV SOLR
2.0000 g | Freq: Two times a day (BID) | INTRAVENOUS | Status: DC
  Administered 2017-05-03: 2 g via INTRAVENOUS
  Filled 2017-05-02 (×3): qty 2

## 2017-05-02 MED ORDER — DOXYCYCLINE HYCLATE 100 MG PO TABS
100.0000 mg | ORAL_TABLET | Freq: Two times a day (BID) | ORAL | Status: DC
  Administered 2017-05-04: 100 mg via ORAL
  Filled 2017-05-02 (×2): qty 1

## 2017-05-02 MED ORDER — DOXYCYCLINE HYCLATE 100 MG IV SOLR
100.0000 mg | Freq: Two times a day (BID) | INTRAVENOUS | Status: DC
  Filled 2017-05-02 (×2): qty 100

## 2017-05-02 MED ORDER — ACETAMINOPHEN 325 MG PO TABS
650.0000 mg | ORAL_TABLET | Freq: Four times a day (QID) | ORAL | Status: DC | PRN
  Administered 2017-05-03: 650 mg via ORAL
  Filled 2017-05-02: qty 2

## 2017-05-02 MED ORDER — ONDANSETRON HCL 4 MG PO TABS: 4.0000 mg | ORAL_TABLET | Freq: Four times a day (QID) | ORAL | Status: DC | PRN

## 2017-05-02 NOTE — ED Notes (Addendum)
Patient is fully dressed again after pelvic exam. Patient is tearful about staying.

## 2017-05-02 NOTE — Consult Note (Signed)
Was notified by ED Dr. Derrill KayGoodman that Alexis Francis presented with 2 day hx of low abdominal pain and emesis.    BP 103/60 (BP Location: Right Arm)   Pulse 88   Temp (!) 100.6 F (38.1 C) (Oral)   Resp 18   Ht 5\' 5"  (1.651 m)   Wt 117.9 kg (260 lb)   LMP 04/24/2017   SpO2 99%   BMI 43.27 kg/m   Results for orders placed or performed during the hospital encounter of 05/02/17 (from the past 24 hour(s))  Lipase, blood     Status: None   Collection Time: 05/02/17  5:50 PM  Result Value Ref Range   Lipase 28 11 - 51 U/L  Comprehensive metabolic panel     Status: Abnormal   Collection Time: 05/02/17  5:50 PM  Result Value Ref Range   Sodium 138 135 - 145 mmol/L   Potassium 3.6 3.5 - 5.1 mmol/L   Chloride 107 101 - 111 mmol/L   CO2 23 22 - 32 mmol/L   Glucose, Bld 117 (H) 65 - 99 mg/dL   BUN 8 6 - 20 mg/dL   Creatinine, Ser 1.610.94 0.44 - 1.00 mg/dL   Calcium 9.4 8.9 - 09.610.3 mg/dL   Total Protein 8.1 6.5 - 8.1 g/dL   Albumin 4.5 3.5 - 5.0 g/dL   AST 26 15 - 41 U/L   ALT 13 (L) 14 - 54 U/L   Alkaline Phosphatase 53 38 - 126 U/L   Total Bilirubin 1.2 0.3 - 1.2 mg/dL   GFR calc non Af Amer >60 >60 mL/min   GFR calc Af Amer >60 >60 mL/min   Anion gap 8 5 - 15  CBC     Status: Abnormal   Collection Time: 05/02/17  5:50 PM  Result Value Ref Range   WBC 13.6 (H) 3.6 - 11.0 K/uL   RBC 4.19 3.80 - 5.20 MIL/uL   Hemoglobin 13.2 12.0 - 16.0 g/dL   HCT 04.539.0 40.935.0 - 81.147.0 %   MCV 93.0 80.0 - 100.0 fL   MCH 31.5 26.0 - 34.0 pg   MCHC 33.9 32.0 - 36.0 g/dL   RDW 91.412.7 78.211.5 - 95.614.5 %   Platelets 288 150 - 440 K/uL  Rapid HIV screen (HIV 1/2 Ab+Ag) ([facility] Only)     Status: None   Collection Time: 05/02/17  5:50 PM  Result Value Ref Range   HIV-1 P24 Antigen - HIV24 NON REACTIVE NON REACTIVE   HIV 1/2 Antibodies NON REACTIVE NON REACTIVE   Interpretation (HIV Ag Ab)      A non reactive test result means that HIV 1 or HIV 2 antibodies and HIV 1 p24 antigen were not detected in the specimen.   Urinalysis, Complete w Microscopic     Status: Abnormal   Collection Time: 05/02/17  6:24 PM  Result Value Ref Range   Color, Urine YELLOW (A) YELLOW   APPearance HAZY (A) CLEAR   Specific Gravity, Urine 1.019 1.005 - 1.030   pH 8.0 5.0 - 8.0   Glucose, UA NEGATIVE NEGATIVE mg/dL   Hgb urine dipstick NEGATIVE NEGATIVE   Bilirubin Urine NEGATIVE NEGATIVE   Ketones, ur NEGATIVE NEGATIVE mg/dL   Protein, ur 30 (A) NEGATIVE mg/dL   Nitrite NEGATIVE NEGATIVE   Leukocytes, UA NEGATIVE NEGATIVE   RBC / HPF 0-5 0 - 5 RBC/hpf   WBC, UA 0-5 0 - 5 WBC/hpf   Bacteria, UA RARE (A) NONE SEEN   Squamous Epithelial /  LPF 6-30 (A) NONE SEEN   Mucous PRESENT   Pregnancy, urine POC     Status: None   Collection Time: 05/02/17  6:34 PM  Result Value Ref Range   Preg Test, Ur NEGATIVE NEGATIVE  Wet prep, genital     Status: Abnormal   Collection Time: 05/02/17  8:51 PM  Result Value Ref Range   Yeast Wet Prep HPF POC NONE SEEN NONE SEEN   Trich, Wet Prep NONE SEEN NONE SEEN   Clue Cells Wet Prep HPF POC NONE SEEN NONE SEEN   WBC, Wet Prep HPF POC MANY (A) NONE SEEN   Sperm NONE SEEN     US Transvaginal Non-ob  Result Date: 05/02/2017 CLINICAL DATA:  Left adnexal pain. EXAM: TRANSABDOMINAL AND TRANSVAGINAL ULTRASOUND OF PELVIS DOPPLER ULTRASOUND OF OVARIES TECHNIQUE: Both transabdominal and transvaginal ultrasound examinations of the pelvis were performed. Transabdominal technique was performed for global imaging of the pelvis including uterus, ovaries, adnexal regions, and pelvic cul-de-sac. It was necessary to proceed with endovaginal exam following the transabdominal exam to visualize the endometrium, ovaries and adnexa. Color and duplex Doppler ultrasound was utilized to evaluate blood flow to the ovaries. COMPARISON:  CT 04/20/2016 FINDINGS: Uterus Measurements: 7.1 x 3.8 x 4.6 cm. No fibroids or other mass visualized. Small nabothian cyst in the cervix. Endometrium Thickness: 6.4 mm.  No focal  abnormality visualized. Right ovary Measurements: 3.1 x 2.3 x 2.5 cm. There is normal blood flow on the right ovary. In the right adnexa is a complex mixed echogenic structure with both fluid and echogenic components, appears adjacent to but separate from the right ovary. This is adjacent to a tubular vascular structure. Left ovary Measurements: 4.3 x 2.9 x 3.0 cm. Within the left ovary is a complex cyst measuring 3.7 x 2.5 x 3.0 cm with lacy internal echoes. There is blood flow to the ovarian parenchyma. Adjacent to the ovary is a tubular vascular structure. Pulsed Doppler evaluation of both ovaries demonstrates normal low-resistance arterial and venous waveforms. Other findings No abnormal free fluid. IMPRESSION: 1. No ovarian torsion. 2. Complex left ovarian cyst measuring 3.5 cm with lacy internal echoes. This may reflect a hemorrhagic cyst or endometrioma. Short-interval follow up ultrasound in 6-12 weeks is recommended, preferably during the week following the patient's normal menses. 3. Tubular vascular structures in both adnexa. On the right there is an adjacent heterogeneous structure with both cystic and echogenic components. Findings are concerning for pelvic inflammatory disease with possible tubo-ovarian abscess on the right. Electronically Signed   By: Rubye Oaks M.D.   On: 05/02/2017 20:38   Assessment:  26yo with TOA/PID with  -lekuocytosis -fever -tachycardia -ultrasound evidence of dilated tubes and complex left ovarian cyst.  Plan:  1. Admit to inpatient for fluids, IV antibiotics (cefoxitin 2g q12hr and doxycycline 100mg  IV q12h until tolerating PO then switch to PO same dose), antiemetics.  If no improvement in 24-48 hours of treatment, then will consider surgery or IR intervention. 2. Pain control:  Will do toradol IV for now and when tolerating PO will change to ibuprofen.  PO tylenol for fever/pain.  Will add IV meds if necessary. 3. CNM Jones to see patient in ED and perform  physical exam and formal admission H&P.  ----- Ranae Plumber, MD Attending Obstetrician and Gynecologist [facility], Department of OB/GYN [facility]

## 2017-05-02 NOTE — ED Notes (Signed)
Patient taken to ultrasound.

## 2017-05-02 NOTE — ED Triage Notes (Signed)
Pt c/o LLQ pain for 2 days with vomiting. Denies being pregnant. Denies urinary sx.  Reports unable to keep liquids down.

## 2017-05-02 NOTE — ED Provider Notes (Signed)
[facility] Provider Note   ____________________________________________   I have reviewed the triage vital signs and the nursing notes.   HISTORY  Chief Complaint Abdominal Pain and Emesis   History limited by: Not Limited   HPI Alexis Francis is a 26 y.o. female who presents to the emergency department today because of concerns for lower abdominal pain, nausea, chills. Patient states that her symptoms started 2 days ago. Started with pain in her lower abdomen. It was worse in the left lower abdomen. Patient states that the pain has progressively gotten worse. It has been somewhat intermittent. She has not knows anything in particular that will bring the pain on. In addition she is now feeling nauseous as well as having chills. Patient does state that she has noticed some change in the frequency of her urine.    History reviewed. No pertinent past medical history.  There are no active problems to display for this patient.   History reviewed. No pertinent surgical history.  Prior to Admission medications   Medication Sig Start Date End Date Taking? Authorizing Provider  benzonatate (TESSALON PERLES) 100 MG capsule Take 2 capsules (200 mg total) by mouth 3 (three) times daily as needed. 03/18/17 03/18/18  Tommi Rumps, PA-C  ondansetron (ZOFRAN ODT) 4 MG disintegrating tablet Take 1 tablet (4 mg total) by mouth every 8 (eight) hours as needed for nausea or vomiting. 03/18/17   Tommi Rumps, PA-C    Allergies Patient has no known allergies.  History reviewed. No pertinent family history.  Social History Social History  Substance Use Topics  . Smoking status: Current Every Day Smoker  . Smokeless tobacco: Never Used  . Alcohol use No    Review of Systems Constitutional: Positive for chills. Eyes: No visual changes. ENT: No sore throat. Cardiovascular: Denies chest pain. Respiratory: Denies shortness of  breath. Gastrointestinal: Positive for lower abdominal pain. Genitourinary: Positive for increased frequency. Musculoskeletal: Negative for back pain. Skin: Negative for rash. Neurological: Negative for headaches, focal weakness or numbness.  ____________________________________________   PHYSICAL EXAM:  VITAL SIGNS: ED Triage Vitals  Enc Vitals Group     BP 05/02/17 1752 (!) 124/91     Pulse Rate 05/02/17 1752 (!) 106     Resp 05/02/17 1752 18     Temp 05/02/17 1752 100.3 F (37.9 C)     Temp Source 05/02/17 1752 Oral     SpO2 05/02/17 1752 99 %     Weight 05/02/17 1750 260 lb (117.9 kg)     Height 05/02/17 1750 5\' 5"  (1.651 m)     Head Circumference --      Peak Flow --      Pain Score 05/02/17 1750 8    Constitutional: Alert and oriented. Well appearing and in no distress. Eyes: Conjunctivae are normal. Normal extraocular movements. ENT   Head: Normocephalic and atraumatic.   Nose: No congestion/rhinnorhea.   Mouth/Throat: Mucous membranes are moist.   Neck: No stridor. Hematological/Lymphatic/Immunilogical: No cervical lymphadenopathy. Cardiovascular: Normal rate, regular rhythm.  No murmurs, rubs, or gallops.  Respiratory: Normal respiratory effort without tachypnea nor retractions. Breath sounds are clear and equal bilaterally. No wheezes/rales/rhonchi. Gastrointestinal: Soft and tender to palpation in the lower abdomen.  Genitourinary: Moderate amount of discharge in vaginal vault. Mild cervical tenderness. Musculoskeletal: Normal range of motion in all extremities. No lower extremity edema. Neurologic:  Normal speech and language. No gross focal neurologic deficits are appreciated.  Skin:  Skin is  warm, dry and intact. No rash noted. Psychiatric: Mood and affect are normal. Speech and behavior are normal. Patient exhibits appropriate insight and judgment.  ____________________________________________    LABS (pertinent positives/negatives)  Labs  Reviewed  WET PREP, GENITAL - Abnormal; Notable for the following:       Result Value   WBC, Wet Prep HPF POC MANY (*)    All other components within normal limits  COMPREHENSIVE METABOLIC PANEL - Abnormal; Notable for the following:    Glucose, Bld 117 (*)    ALT 13 (*)    All other components within normal limits  CBC - Abnormal; Notable for the following:    WBC 13.6 (*)    All other components within normal limits  URINALYSIS, COMPLETE (UACMP) WITH MICROSCOPIC - Abnormal; Notable for the following:    Color, Urine YELLOW (*)    APPearance HAZY (*)    Protein, ur 30 (*)    Bacteria, UA RARE (*)    Squamous Epithelial / LPF 6-30 (*)    All other components within normal limits  CHLAMYDIA/NGC RT PCR ([facility] ONLY)  LIPASE, BLOOD  RAPID HIV SCREEN (HIV 1/2 AB+AG)  CBC WITH DIFFERENTIAL/PLATELET  POC URINE PREG, ED  POCT PREGNANCY, URINE     ____________________________________________   EKG  None  ____________________________________________    RADIOLOGY  US  IMPRESSION:  1. No ovarian torsion.  2. Complex left ovarian cyst measuring 3.5 cm with lacy internal  echoes. This may reflect a hemorrhagic cyst or endometrioma.  Short-interval follow up ultrasound in 6-12 weeks is recommended,  preferably during the week following the patient's normal menses.  3. Tubular vascular structures in both adnexa. On the right there is  an adjacent heterogeneous structure with both cystic and echogenic  components. Findings are concerning for pelvic inflammatory disease  with possible tubo-ovarian abscess on the right.    ___________________________________________   PROCEDURES  Procedures  ____________________________________________   INITIAL IMPRESSION / ASSESSMENT AND PLAN / ED COURSE  Pertinent labs & imaging results that were available during my care of the patient were reviewed by me and considered in my medical decision making (see chart for  details).  Patient presented to the emergency department today because of concerns for lower abdominal pain. The patient had an ultrasound performed which did show findings concerning for tubo-ovarian abscess. Patient was given IV antibiotics and will be admitted to the OB/GYN service.  ____________________________________________   FINAL CLINICAL IMPRESSION(S) / ED DIAGNOSES  Final diagnoses:  Pelvic pain  PID (acute pelvic inflammatory disease)     Note: This dictation was prepared with Dragon dictation. Any transcriptional errors that result from this process are unintentional     Phineas SemenGoodman, Talin Rozeboom, MD 05/02/17 [phone]

## 2017-05-02 NOTE — H&P (Signed)
Consult History and Physical   SERVICE: Gynecology [facility] Admit Day 05/02/17  Patient Name: MACKINZIE VUNCANNON Patient MRN:   [id number]  CC: "Lower pelvic pain x 2-3 days" and vomiting this pm along with chills this pm. L>R side pain.   HPI: KIYOKO MCGUIRT is a 26 y.o.  Who has had this pain for the past few days and did not to come to the hospital for evaluation.   ROS: GEN:  + fevers, +chills, no weight changes, appetite changes, fatigue, night sweats HEENT:  HA, vision changes, hearing loss, congestion, rhinorrhea, sinus pressure, dysphagia CV:   CP, palpitations PULM:  SOB, cough GI:   +abd pain Lt >Rt, + N/+V/no D/C GU:  dysuria, urgency, frequency MSK:  arthralgias, myalgias, back pain, swelling SKIN:  rashes, color changes, pallor NEURO:  numbness, weakness, tingling, seizures, dizziness, tremors PSYCH:  depression, anxiety, behavioral problems, confusion  HEME/LYMPH:  easy bruising or bleeding ENDO:  heat/cold intolerance  Past Gynecologic History: Patient's last menstrual period was 04/24/2017.  Urine preg neg in ER tonight.  Past Medical History: History reviewed. No pertinent past medical history.  Past Surgical History:  History reviewed. No pertinent surgical history.  Family History:  family history is not on file.  Social History:  Social History   Social History  . Marital status: Single    Spouse name: N/A  . Number of children: N/A  . Years of education: N/A   Occupational History  . Not on file.   Social History Main Topics  . Smoking status: Current Every Day Smoker  . Smokeless tobacco: Never Used  . Alcohol use No  . Drug use: No  . Sexual activity: Not on file   Other Topics Concern  . Not on file   Social History Narrative  . No narrative on file    Home Medications:  Medications reconciled in EPIC  No current facility-administered medications on file prior to encounter.    Current Outpatient Prescriptions on File Prior to Encounter   Medication Sig Dispense Refill  . benzonatate (TESSALON PERLES) 100 MG capsule Take 2 capsules (200 mg total) by mouth 3 (three) times daily as needed. (Patient not taking: Reported on 05/02/2017) 30 capsule 0  . ondansetron (ZOFRAN ODT) 4 MG disintegrating tablet Take 1 tablet (4 mg total) by mouth every 8 (eight) hours as needed for nausea or vomiting. (Patient not taking: Reported on 05/02/2017) 12 tablet 0    Allergies:  No Known Allergies  Physical Exam:  Temp:  [100.3 F (37.9 C)-100.6 F (38.1 C)] 100.6 F (38.1 C) (05/10 2135) Pulse Rate:  [88-106] 88 (05/10 2135) Resp:  [18] 18 (05/10 2135) BP: (103-124)/(60-91) 103/60 (05/10 2135) SpO2:  [99 %] 99 % (05/10 2135) Weight:  [260 lb (117.9 kg)] 260 lb (117.9 kg) (05/10 1750)   General Appearance:  Well developed, well nourished, no acute distress, alert and oriented x3 HEENT:  Normocephalic atraumatic, extraocular movements intact, moist mucous membranes. Eyes "bloodshot" from crying.  Cardiovascular:  Normal S1/S2, regular rate and rhythm, no murmurs Pulmonary:  clear to auscultation, no wheezes, rales or rhonchi, symmetric air entry, good air exchange Abdomen:  Bowel sounds present, soft, Sl tender to palp in upper and mostly lower pelvic area, nondistended, no abnormal masses, no epigastric pain Extremities:  Full range of motion, no pedal edema, 2+ distal pulses, no tenderness Skin:  normal coloration and turgor, no rashes, no suspicious skin lesions noted  Psychiatric:  Normal mood and affect, appropriate,  Pelvic:  Done in ER (see notes)   Labs/Studies:   CBC and Coags:  Lab Results  Component Value Date   WBC 13.6 (H) 05/02/2017   NEUTOPHILPCT 54 04/20/2016   EOSPCT 2 04/20/2016   BASOPCT 1 04/20/2016   LYMPHOPCT 32 04/20/2016   HGB 13.2 05/02/2017   HCT 39.0 05/02/2017   MCV 93.0 05/02/2017   PLT 288 05/02/2017   CMP:  Lab Results  Component Value Date   NA 138 05/02/2017   K 3.6 05/02/2017   CL 107  05/02/2017   CO2 23 05/02/2017   BUN 8 05/02/2017   CREATININE 0.94 05/02/2017   CREATININE 0.92 10/01/2016   CREATININE 1.10 (H) 04/20/2016   PROT 8.1 05/02/2017   BILITOT 1.2 05/02/2017   ALT 13 (L) 05/02/2017   AST 26 05/02/2017   ALKPHOS 53 05/02/2017  GC/CH neg in ER Wet Prep: Neg Trich, Neg hyphae, neg Clue  TVUS:  Other Imaging: US Transvaginal Non-ob  Result Date: 05/02/2017 CLINICAL DATA:  Left adnexal pain. EXAM: TRANSABDOMINAL AND TRANSVAGINAL ULTRASOUND OF PELVIS DOPPLER ULTRASOUND OF OVARIES TECHNIQUE: Both transabdominal and transvaginal ultrasound examinations of the pelvis were performed. Transabdominal technique was performed for global imaging of the pelvis including uterus, ovaries, adnexal regions, and pelvic cul-de-sac. It was necessary to proceed with endovaginal exam following the transabdominal exam to visualize the endometrium, ovaries and adnexa. Color and duplex Doppler ultrasound was utilized to evaluate blood flow to the ovaries. COMPARISON:  CT 04/20/2016 FINDINGS: Uterus Measurements: 7.1 x 3.8 x 4.6 cm. No fibroids or other mass visualized. Small nabothian cyst in the cervix. Endometrium Thickness: 6.4 mm.  No focal abnormality visualized. Right ovary Measurements: 3.1 x 2.3 x 2.5 cm. There is normal blood flow on the right ovary. In the right adnexa is a complex mixed echogenic structure with both fluid and echogenic components, appears adjacent to but separate from the right ovary. This is adjacent to a tubular vascular structure. Left ovary Measurements: 4.3 x 2.9 x 3.0 cm. Within the left ovary is a complex cyst measuring 3.7 x 2.5 x 3.0 cm with lacy internal echoes. There is blood flow to the ovarian parenchyma. Adjacent to the ovary is a tubular vascular structure. Pulsed Doppler evaluation of both ovaries demonstrates normal low-resistance arterial and venous waveforms. Other findings No abnormal free fluid. IMPRESSION: 1. No ovarian torsion. 2. Complex left  ovarian cyst measuring 3.5 cm with lacy internal echoes. This may reflect a hemorrhagic cyst or endometrioma. Short-interval follow up ultrasound in 6-12 weeks is recommended, preferably during the week following the patient's normal menses. 3. Tubular vascular structures in both adnexa. On the right there is an adjacent heterogeneous structure with both cystic and echogenic components. Findings are concerning for pelvic inflammatory disease with possible tubo-ovarian abscess on the right. Electronically Signed   By: Rubye Oaks M.D.   On: 05/02/2017 20:38   US Pelvis Complete  Result Date: 05/02/2017 CLINICAL DATA:  Left adnexal pain. EXAM: TRANSABDOMINAL AND TRANSVAGINAL ULTRASOUND OF PELVIS DOPPLER ULTRASOUND OF OVARIES TECHNIQUE: Both transabdominal and transvaginal ultrasound examinations of the pelvis were performed. Transabdominal technique was performed for global imaging of the pelvis including uterus, ovaries, adnexal regions, and pelvic cul-de-sac. It was necessary to proceed with endovaginal exam following the transabdominal exam to visualize the endometrium, ovaries and adnexa. Color and duplex Doppler ultrasound was utilized to evaluate blood flow to the ovaries. COMPARISON:  CT 04/20/2016 FINDINGS: Uterus Measurements: 7.1 x 3.8 x 4.6 cm. No fibroids or other  mass visualized. Small nabothian cyst in the cervix. Endometrium Thickness: 6.4 mm.  No focal abnormality visualized. Right ovary Measurements: 3.1 x 2.3 x 2.5 cm. There is normal blood flow on the right ovary. In the right adnexa is a complex mixed echogenic structure with both fluid and echogenic components, appears adjacent to but separate from the right ovary. This is adjacent to a tubular vascular structure. Left ovary Measurements: 4.3 x 2.9 x 3.0 cm. Within the left ovary is a complex cyst measuring 3.7 x 2.5 x 3.0 cm with lacy internal echoes. There is blood flow to the ovarian parenchyma. Adjacent to the ovary is a tubular  vascular structure. Pulsed Doppler evaluation of both ovaries demonstrates normal low-resistance arterial and venous waveforms. Other findings No abnormal free fluid. IMPRESSION: 1. No ovarian torsion. 2. Complex left ovarian cyst measuring 3.5 cm with lacy internal echoes. This may reflect a hemorrhagic cyst or endometrioma. Short-interval follow up ultrasound in 6-12 weeks is recommended, preferably during the week following the patient's normal menses. 3. Tubular vascular structures in both adnexa. On the right there is an adjacent heterogeneous structure with both cystic and echogenic components. Findings are concerning for pelvic inflammatory disease with possible tubo-ovarian abscess on the right. Electronically Signed   By: Rubye OaksMelanie  Ehinger M.D.   On: 05/02/2017 20:38   [place] Art/ven Flow Abd Pelv Doppler  Result Date: 05/02/2017 CLINICAL DATA:  Left adnexal pain. EXAM: TRANSABDOMINAL AND TRANSVAGINAL ULTRASOUND OF PELVIS DOPPLER ULTRASOUND OF OVARIES TECHNIQUE: Both transabdominal and transvaginal ultrasound examinations of the pelvis were performed. Transabdominal technique was performed for global imaging of the pelvis including uterus, ovaries, adnexal regions, and pelvic cul-de-sac. It was necessary to proceed with endovaginal exam following the transabdominal exam to visualize the endometrium, ovaries and adnexa. Color and duplex Doppler ultrasound was utilized to evaluate blood flow to the ovaries. COMPARISON:  CT 04/20/2016 FINDINGS: Uterus Measurements: 7.1 x 3.8 x 4.6 cm. No fibroids or other mass visualized. Small nabothian cyst in the cervix. Endometrium Thickness: 6.4 mm.  No focal abnormality visualized. Right ovary Measurements: 3.1 x 2.3 x 2.5 cm. There is normal blood flow on the right ovary. In the right adnexa is a complex mixed echogenic structure with both fluid and echogenic components, appears adjacent to but separate from the right ovary. This is adjacent to a tubular vascular  structure. Left ovary Measurements: 4.3 x 2.9 x 3.0 cm. Within the left ovary is a complex cyst measuring 3.7 x 2.5 x 3.0 cm with lacy internal echoes. There is blood flow to the ovarian parenchyma. Adjacent to the ovary is a tubular vascular structure. Pulsed Doppler evaluation of both ovaries demonstrates normal low-resistance arterial and venous waveforms. Other findings No abnormal free fluid. IMPRESSION: 1. No ovarian torsion. 2. Complex left ovarian cyst measuring 3.5 cm with lacy internal echoes. This may reflect a hemorrhagic cyst or endometrioma. Short-interval follow up ultrasound in 6-12 weeks is recommended, preferably during the week following the patient's normal menses. 3. Tubular vascular structures in both adnexa. On the right there is an adjacent heterogeneous structure with both cystic and echogenic components. Findings are concerning for pelvic inflammatory disease with possible tubo-ovarian abscess on the right. Electronically Signed   By: Rubye OaksMelanie  Ehinger M.D.   On: 05/02/2017 20:38     Assessment / Plan:   Llana AlimentLATOYA D Mcgilvray is a 26 y.o. with TOA/PID  1.See orders for admission per Dr Elesa MassedWard 2. IV hydration and meds for fever, pain, antibitotics  Sharee Pimplearon W. Philopateer Strine,  RN, MSN, CNM FNP

## 2017-05-03 LAB — CBC WITH DIFFERENTIAL/PLATELET
Basophils Absolute: 0 10*3/uL (ref 0–0.1)
Basophils Relative: 0 %
EOS ABS: 0 10*3/uL (ref 0–0.7)
EOS PCT: 0 %
HCT: 34.8 % — ABNORMAL LOW (ref 35.0–47.0)
Hemoglobin: 12.1 g/dL (ref 12.0–16.0)
LYMPHS ABS: 1.2 10*3/uL (ref 1.0–3.6)
LYMPHS PCT: 6 %
MCH: 32.5 pg (ref 26.0–34.0)
MCHC: 34.9 g/dL (ref 32.0–36.0)
MCV: 93.2 fL (ref 80.0–100.0)
MONO ABS: 1.8 10*3/uL — AB (ref 0.2–0.9)
MONOS PCT: 9 %
Neutro Abs: 16.1 10*3/uL — ABNORMAL HIGH (ref 1.4–6.5)
Neutrophils Relative %: 85 %
PLATELETS: 264 10*3/uL (ref 150–440)
RBC: 3.73 MIL/uL — AB (ref 3.80–5.20)
RDW: 13.1 % (ref 11.5–14.5)
WBC: 19.1 10*3/uL — AB (ref 3.6–11.0)

## 2017-05-03 MED ORDER — CEFOXITIN SODIUM-DEXTROSE 2-2.2 GM-% IV SOLR (PREMIX)
2.0000 g | Freq: Two times a day (BID) | INTRAVENOUS | Status: DC
  Administered 2017-05-04 (×2): 2000 mg via INTRAVENOUS
  Filled 2017-05-03 (×3): qty 50
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
## 2017-05-03 NOTE — Progress Notes (Signed)
Obstetric and Gynecology  Subjective  Alexis Francis is a 26 y.o. female  who presented on 05/02/2017 for abdominal pain, and was found to have elevated WBC with hydrosalpinx on imaging. Cultures neg in the ER.  Her pain is improved, now 0/10 and was 8/10 on admission. She is getting scheduled Toradol.  Still afebrile. WBC elevated from admission from 13.9--> 19.1.  Objective   Vitals:   05/03/17 0401 05/03/17 0827  BP: (!) 123/49 (!) 111/55  Pulse: 93 82  Resp:  18  Temp:  99 F (37.2 C)     Intake/Output Summary (Last 24 hours) at 05/03/17 1140 Last data filed at 05/03/17 0900  Gross per 24 hour  Intake             1120 ml  Output                0 ml  Net             1120 ml    General: NAD Cardiovascular: RRR, no murmurs Pulmonary: CTAB Abdomen: Benign. Non-tender, +BS, no guarding. Extremities: No erythema or cords, no calf tenderness, +warmth with normal peripheral pulses.  Labs: Results for orders placed or performed during the hospital encounter of 05/02/17 (from the past 24 hour(s))  Lipase, blood     Status: None   Collection Time: 05/02/17  5:50 PM  Result Value Ref Range   Lipase 28 11 - 51 U/L  Comprehensive metabolic panel     Status: Abnormal   Collection Time: 05/02/17  5:50 PM  Result Value Ref Range   Sodium 138 135 - 145 mmol/L   Potassium 3.6 3.5 - 5.1 mmol/L   Chloride 107 101 - 111 mmol/L   CO2 23 22 - 32 mmol/L   Glucose, Bld 117 (H) 65 - 99 mg/dL   BUN 8 6 - 20 mg/dL   Creatinine, Ser 1.61 0.44 - 1.00 mg/dL   Calcium 9.4 8.9 - 09.6 mg/dL   Total Protein 8.1 6.5 - 8.1 g/dL   Albumin 4.5 3.5 - 5.0 g/dL   AST 26 15 - 41 U/L   ALT 13 (L) 14 - 54 U/L   Alkaline Phosphatase 53 38 - 126 U/L   Total Bilirubin 1.2 0.3 - 1.2 mg/dL   GFR calc non Af Amer >60 >60 mL/min   GFR calc Af Amer >60 >60 mL/min   Anion gap 8 5 - 15  CBC     Status: Abnormal   Collection Time: 05/02/17  5:50 PM  Result Value Ref Range   WBC 13.6 (H) 3.6 - 11.0 K/uL    RBC 4.19 3.80 - 5.20 MIL/uL   Hemoglobin 13.2 12.0 - 16.0 g/dL   HCT 04.5 40.9 - 81.1 %   MCV 93.0 80.0 - 100.0 fL   MCH 31.5 26.0 - 34.0 pg   MCHC 33.9 32.0 - 36.0 g/dL   RDW 91.4 78.2 - 95.6 %   Platelets 288 150 - 440 K/uL  Rapid HIV screen (HIV 1/2 Ab+Ag) ([facility] Only)     Status: None   Collection Time: 05/02/17  5:50 PM  Result Value Ref Range   HIV-1 P24 Antigen - HIV24 NON REACTIVE NON REACTIVE   HIV 1/2 Antibodies NON REACTIVE NON REACTIVE   Interpretation (HIV Ag Ab)      A non reactive test result means that HIV 1 or HIV 2 antibodies and HIV 1 p24 antigen were not detected in the specimen.  Urinalysis, Complete  w Microscopic     Status: Abnormal   Collection Time: 05/02/17  6:24 PM  Result Value Ref Range   Color, Urine YELLOW (A) YELLOW   APPearance HAZY (A) CLEAR   Specific Gravity, Urine 1.019 1.005 - 1.030   pH 8.0 5.0 - 8.0   Glucose, UA NEGATIVE NEGATIVE mg/dL   Hgb urine dipstick NEGATIVE NEGATIVE   Bilirubin Urine NEGATIVE NEGATIVE   Ketones, ur NEGATIVE NEGATIVE mg/dL   Protein, ur 30 (A) NEGATIVE mg/dL   Nitrite NEGATIVE NEGATIVE   Leukocytes, UA NEGATIVE NEGATIVE   RBC / HPF 0-5 0 - 5 RBC/hpf   WBC, UA 0-5 0 - 5 WBC/hpf   Bacteria, UA RARE (A) NONE SEEN   Squamous Epithelial / LPF 6-30 (A) NONE SEEN   Mucous PRESENT   Pregnancy, urine POC     Status: None   Collection Time: 05/02/17  6:34 PM  Result Value Ref Range   Preg Test, Ur NEGATIVE NEGATIVE  Wet prep, genital     Status: Abnormal   Collection Time: 05/02/17  8:51 PM  Result Value Ref Range   Yeast Wet Prep HPF POC NONE SEEN NONE SEEN   Trich, Wet Prep NONE SEEN NONE SEEN   Clue Cells Wet Prep HPF POC NONE SEEN NONE SEEN   WBC, Wet Prep HPF POC MANY (A) NONE SEEN   Sperm NONE SEEN   Chlamydia/NGC rt PCR ([facility] only)     Status: None   Collection Time: 05/02/17  8:51 PM  Result Value Ref Range   Specimen source GC/Chlam ENDOCERVICAL    Chlamydia Tr NOT DETECTED NOT DETECTED   N gonorrhoeae  NOT DETECTED NOT DETECTED  CBC WITH DIFFERENTIAL     Status: Abnormal   Collection Time: 05/03/17  4:54 AM  Result Value Ref Range   WBC 19.1 (H) 3.6 - 11.0 K/uL   RBC 3.73 (L) 3.80 - 5.20 MIL/uL   Hemoglobin 12.1 12.0 - 16.0 g/dL   HCT 16.1 (L) 09.6 - 04.5 %   MCV 93.2 80.0 - 100.0 fL   MCH 32.5 26.0 - 34.0 pg   MCHC 34.9 32.0 - 36.0 g/dL   RDW 40.9 81.1 - 91.4 %   Platelets 264 150 - 440 K/uL   Neutrophils Relative % 85 %   Neutro Abs 16.1 (H) 1.4 - 6.5 K/uL   Lymphocytes Relative 6 %   Lymphs Abs 1.2 1.0 - 3.6 K/uL   Monocytes Relative 9 %   Monocytes Absolute 1.8 (H) 0.2 - 0.9 K/uL   Eosinophils Relative 0 %   Eosinophils Absolute 0.0 0 - 0.7 K/uL   Basophils Relative 0 %   Basophils Absolute 0.0 0 - 0.1 K/uL    Cultures: Results for orders placed or performed during the hospital encounter of 05/02/17  Wet prep, genital     Status: Abnormal   Collection Time: 05/02/17  8:51 PM  Result Value Ref Range Status   Yeast Wet Prep HPF POC NONE SEEN NONE SEEN Final   Trich, Wet Prep NONE SEEN NONE SEEN Final   Clue Cells Wet Prep HPF POC NONE SEEN NONE SEEN Final   WBC, Wet Prep HPF POC MANY (A) NONE SEEN Final   Sperm NONE SEEN  Final  Chlamydia/NGC rt PCR ([facility] only)     Status: None   Collection Time: 05/02/17  8:51 PM  Result Value Ref Range Status   Specimen source GC/Chlam ENDOCERVICAL  Final   Chlamydia Tr NOT DETECTED NOT  DETECTED Final   N gonorrhoeae NOT DETECTED NOT DETECTED Final    Comment: (NOTE) 100  This methodology has not been evaluated in pregnant women or in 200  patients with a history of hysterectomy. 300 400  This methodology will not be performed on patients less than 86  years of age.     Imaging: US Transvaginal Non-ob  Result Date: 05/02/2017 CLINICAL DATA:  Left adnexal pain. EXAM: TRANSABDOMINAL AND TRANSVAGINAL ULTRASOUND OF PELVIS DOPPLER ULTRASOUND OF OVARIES TECHNIQUE: Both transabdominal and transvaginal ultrasound examinations of the  pelvis were performed. Transabdominal technique was performed for global imaging of the pelvis including uterus, ovaries, adnexal regions, and pelvic cul-de-sac. It was necessary to proceed with endovaginal exam following the transabdominal exam to visualize the endometrium, ovaries and adnexa. Color and duplex Doppler ultrasound was utilized to evaluate blood flow to the ovaries. COMPARISON:  CT 04/20/2016 FINDINGS: Uterus Measurements: 7.1 x 3.8 x 4.6 cm. No fibroids or other mass visualized. Small nabothian cyst in the cervix. Endometrium Thickness: 6.4 mm.  No focal abnormality visualized. Right ovary Measurements: 3.1 x 2.3 x 2.5 cm. There is normal blood flow on the right ovary. In the right adnexa is a complex mixed echogenic structure with both fluid and echogenic components, appears adjacent to but separate from the right ovary. This is adjacent to a tubular vascular structure. Left ovary Measurements: 4.3 x 2.9 x 3.0 cm. Within the left ovary is a complex cyst measuring 3.7 x 2.5 x 3.0 cm with lacy internal echoes. There is blood flow to the ovarian parenchyma. Adjacent to the ovary is a tubular vascular structure. Pulsed Doppler evaluation of both ovaries demonstrates normal low-resistance arterial and venous waveforms. Other findings No abnormal free fluid. IMPRESSION: 1. No ovarian torsion. 2. Complex left ovarian cyst measuring 3.5 cm with lacy internal echoes. This may reflect a hemorrhagic cyst or endometrioma. Short-interval follow up ultrasound in 6-12 weeks is recommended, preferably during the week following the patient's normal menses. 3. Tubular vascular structures in both adnexa. On the right there is an adjacent heterogeneous structure with both cystic and echogenic components. Findings are concerning for pelvic inflammatory disease with possible tubo-ovarian abscess on the right. Electronically Signed   By: Rubye Oaks M.D.   On: 05/02/2017 20:38   US Pelvis Complete  Result Date:  05/02/2017 CLINICAL DATA:  Left adnexal pain. EXAM: TRANSABDOMINAL AND TRANSVAGINAL ULTRASOUND OF PELVIS DOPPLER ULTRASOUND OF OVARIES TECHNIQUE: Both transabdominal and transvaginal ultrasound examinations of the pelvis were performed. Transabdominal technique was performed for global imaging of the pelvis including uterus, ovaries, adnexal regions, and pelvic cul-de-sac. It was necessary to proceed with endovaginal exam following the transabdominal exam to visualize the endometrium, ovaries and adnexa. Color and duplex Doppler ultrasound was utilized to evaluate blood flow to the ovaries. COMPARISON:  CT 04/20/2016 FINDINGS: Uterus Measurements: 7.1 x 3.8 x 4.6 cm. No fibroids or other mass visualized. Small nabothian cyst in the cervix. Endometrium Thickness: 6.4 mm.  No focal abnormality visualized. Right ovary Measurements: 3.1 x 2.3 x 2.5 cm. There is normal blood flow on the right ovary. In the right adnexa is a complex mixed echogenic structure with both fluid and echogenic components, appears adjacent to but separate from the right ovary. This is adjacent to a tubular vascular structure. Left ovary Measurements: 4.3 x 2.9 x 3.0 cm. Within the left ovary is a complex cyst measuring 3.7 x 2.5 x 3.0 cm with lacy internal echoes. There is blood flow to the ovarian  parenchyma. Adjacent to the ovary is a tubular vascular structure. Pulsed Doppler evaluation of both ovaries demonstrates normal low-resistance arterial and venous waveforms. Other findings No abnormal free fluid. IMPRESSION: 1. No ovarian torsion. 2. Complex left ovarian cyst measuring 3.5 cm with lacy internal echoes. This may reflect a hemorrhagic cyst or endometrioma. Short-interval follow up ultrasound in 6-12 weeks is recommended, preferably during the week following the patient's normal menses. 3. Tubular vascular structures in both adnexa. On the right there is an adjacent heterogeneous structure with both cystic and echogenic components.  Findings are concerning for pelvic inflammatory disease with possible tubo-ovarian abscess on the right. Electronically Signed   By: Rubye OaksMelanie  Ehinger M.D.   On: 05/02/2017 20:38   [place] Art/ven Flow Abd Pelv Doppler  Result Date: 05/02/2017 CLINICAL DATA:  Left adnexal pain. EXAM: TRANSABDOMINAL AND TRANSVAGINAL ULTRASOUND OF PELVIS DOPPLER ULTRASOUND OF OVARIES TECHNIQUE: Both transabdominal and transvaginal ultrasound examinations of the pelvis were performed. Transabdominal technique was performed for global imaging of the pelvis including uterus, ovaries, adnexal regions, and pelvic cul-de-sac. It was necessary to proceed with endovaginal exam following the transabdominal exam to visualize the endometrium, ovaries and adnexa. Color and duplex Doppler ultrasound was utilized to evaluate blood flow to the ovaries. COMPARISON:  CT 04/20/2016 FINDINGS: Uterus Measurements: 7.1 x 3.8 x 4.6 cm. No fibroids or other mass visualized. Small nabothian cyst in the cervix. Endometrium Thickness: 6.4 mm.  No focal abnormality visualized. Right ovary Measurements: 3.1 x 2.3 x 2.5 cm. There is normal blood flow on the right ovary. In the right adnexa is a complex mixed echogenic structure with both fluid and echogenic components, appears adjacent to but separate from the right ovary. This is adjacent to a tubular vascular structure. Left ovary Measurements: 4.3 x 2.9 x 3.0 cm. Within the left ovary is a complex cyst measuring 3.7 x 2.5 x 3.0 cm with lacy internal echoes. There is blood flow to the ovarian parenchyma. Adjacent to the ovary is a tubular vascular structure. Pulsed Doppler evaluation of both ovaries demonstrates normal low-resistance arterial and venous waveforms. Other findings No abnormal free fluid. IMPRESSION: 1. No ovarian torsion. 2. Complex left ovarian cyst measuring 3.5 cm with lacy internal echoes. This may reflect a hemorrhagic cyst or endometrioma. Short-interval follow up ultrasound in 6-12 weeks is  recommended, preferably during the week following the patient's normal menses. 3. Tubular vascular structures in both adnexa. On the right there is an adjacent heterogeneous structure with both cystic and echogenic components. Findings are concerning for pelvic inflammatory disease with possible tubo-ovarian abscess on the right. Electronically Signed   By: Rubye OaksMelanie  Ehinger M.D.   On: 05/02/2017 20:38     Assessment   26 y.o.  Hospital Day: 2   Plan   1. DDx PID, treating with iv cefoxy/doxy. Will transition to po doxy when tolerating po without nausea.  2. Continue abx until clinical improvement (dec WBC, afebrile, pain resolving wtihout medication) and consider d/c home on po abx. Salpingectomy an option if no improvement in 72hrs.

## 2017-05-04 LAB — CBC WITH DIFFERENTIAL/PLATELET
Basophils Absolute: 0 10*3/uL (ref 0–0.1)
Basophils Relative: 0 %
EOS ABS: 0 10*3/uL (ref 0–0.7)
Eosinophils Relative: 0 %
HCT: 34.5 % — ABNORMAL LOW (ref 35.0–47.0)
Hemoglobin: 11.7 g/dL — ABNORMAL LOW (ref 12.0–16.0)
LYMPHS ABS: 1.3 10*3/uL (ref 1.0–3.6)
LYMPHS PCT: 10 %
MCH: 32.3 pg (ref 26.0–34.0)
MCHC: 34 g/dL (ref 32.0–36.0)
MCV: 95.1 fL (ref 80.0–100.0)
MONO ABS: 1.1 10*3/uL — AB (ref 0.2–0.9)
MONOS PCT: 8 %
Neutro Abs: 10.4 10*3/uL — ABNORMAL HIGH (ref 1.4–6.5)
Neutrophils Relative %: 82 %
PLATELETS: 228 10*3/uL (ref 150–440)
RBC: 3.63 MIL/uL — AB (ref 3.80–5.20)
RDW: 12.8 % (ref 11.5–14.5)
WBC: 12.8 10*3/uL — AB (ref 3.6–11.0)

## 2017-05-04 MED ORDER — DOXYCYCLINE MONOHYDRATE 100 MG PO CAPS
100.0000 mg | ORAL_CAPSULE | Freq: Two times a day (BID) | ORAL | 0 refills | Status: DC
Start: 2017-05-04 — End: 2017-09-09

## 2017-05-04 MED ORDER — TRAMADOL HCL 50 MG PO TABS: 50.0000 mg | ORAL_TABLET | Freq: Four times a day (QID) | ORAL | 0 refills | Status: DC | PRN

## 2017-05-04 NOTE — Progress Notes (Signed)
Obstetric and Gynecology  Subjective  Alexis Francis is a 26 y.o. female No obstetric history on file. who presented on 05/02/2017 for L>R lower pelvic and abd pain with hydrosalpinx found on CT scan indicating poss Rt tuboovarian Abcess and Lt complex ovarian cyst. Pt has very minimal discomfort today and is asking to go home.      Objective   Vitals:   05/04/17 0314 05/04/17 0833  BP: 127/73 121/74  Pulse: 83 77  Resp: 16 17  Temp: 99.4 F (37.4 C) 98.4 F (36.9 C)     Intake/Output Summary (Last 24 hours) at 05/04/17 0926 Last data filed at 05/04/17 0645  Gross per 24 hour  Intake          4484.67 ml  Output             2600 ml  Net          1884.67 ml    General: NAD, A,A&Ox3 Cardiovascular: RRR, no murmurs Pulmonary: CTAB, no W/R/R. Abdomen: Benign. Non-tender, +BS, no guarding. Pt is minimally tender in the LLQ.  Extremities: No erythema or cords, no calf tenderness, +warmth with normal peripheral pulses.  Labs: Results for orders placed or performed during the hospital encounter of 05/02/17 (from the past 24 hour(s))  CBC WITH DIFFERENTIAL     Status: Abnormal   Collection Time: 05/04/17  4:20 AM  Result Value Ref Range   WBC 12.8 (H) 3.6 - 11.0 K/uL   RBC 3.63 (L) 3.80 - 5.20 MIL/uL   Hemoglobin 11.7 (L) 12.0 - 16.0 g/dL   HCT 16.134.5 (L) 09.635.0 - 04.547.0 %   MCV 95.1 80.0 - 100.0 fL   MCH 32.3 26.0 - 34.0 pg   MCHC 34.0 32.0 - 36.0 g/dL   RDW 40.912.8 81.111.5 - 91.414.5 %   Platelets 228 150 - 440 K/uL   Neutrophils Relative % 82 %   Neutro Abs 10.4 (H) 1.4 - 6.5 K/uL   Lymphocytes Relative 10 %   Lymphs Abs 1.3 1.0 - 3.6 K/uL   Monocytes Relative 8 %   Monocytes Absolute 1.1 (H) 0.2 - 0.9 K/uL   Eosinophils Relative 0 %   Eosinophils Absolute 0.0 0 - 0.7 K/uL   Basophils Relative 0 %   Basophils Absolute 0.0 0 - 0.1 K/uL    Cultures: Results for orders placed or performed during the hospital encounter of 05/02/17  Wet prep, genital     Status: Abnormal    Collection Time: 05/02/17  8:51 PM  Result Value Ref Range Status   Yeast Wet Prep HPF POC NONE SEEN NONE SEEN Final   Trich, Wet Prep NONE SEEN NONE SEEN Final   Clue Cells Wet Prep HPF POC NONE SEEN NONE SEEN Final   WBC, Wet Prep HPF POC MANY (A) NONE SEEN Final   Sperm NONE SEEN  Final  Chlamydia/NGC rt PCR ([facility] only)     Status: None   Collection Time: 05/02/17  8:51 PM  Result Value Ref Range Status   Specimen source GC/Chlam ENDOCERVICAL  Final   Chlamydia Tr NOT DETECTED NOT DETECTED Final   N gonorrhoeae NOT DETECTED NOT DETECTED Final    Comment: (NOTE) 100  This methodology has not been evaluated in pregnant women or in 200  patients with a history of hysterectomy. 300 400  This methodology will not be performed on patients less than 7114  years of age.     Imaging: [place] Transvaginal Non-ob  Result Date: 05/02/2017  CLINICAL DATA:  Left adnexal pain. EXAM: TRANSABDOMINAL AND TRANSVAGINAL ULTRASOUND OF PELVIS DOPPLER ULTRASOUND OF OVARIES TECHNIQUE: Both transabdominal and transvaginal ultrasound examinations of the pelvis were performed. Transabdominal technique was performed for global imaging of the pelvis including uterus, ovaries, adnexal regions, and pelvic cul-de-sac. It was necessary to proceed with endovaginal exam following the transabdominal exam to visualize the endometrium, ovaries and adnexa. Color and duplex Doppler ultrasound was utilized to evaluate blood flow to the ovaries. COMPARISON:  CT 04/20/2016 FINDINGS: Uterus Measurements: 7.1 x 3.8 x 4.6 cm. No fibroids or other mass visualized. Small nabothian cyst in the cervix. Endometrium Thickness: 6.4 mm.  No focal abnormality visualized. Right ovary Measurements: 3.1 x 2.3 x 2.5 cm. There is normal blood flow on the right ovary. In the right adnexa is a complex mixed echogenic structure with both fluid and echogenic components, appears adjacent to but separate from the right ovary. This is adjacent to a tubular  vascular structure. Left ovary Measurements: 4.3 x 2.9 x 3.0 cm. Within the left ovary is a complex cyst measuring 3.7 x 2.5 x 3.0 cm with lacy internal echoes. There is blood flow to the ovarian parenchyma. Adjacent to the ovary is a tubular vascular structure. Pulsed Doppler evaluation of both ovaries demonstrates normal low-resistance arterial and venous waveforms. Other findings No abnormal free fluid. IMPRESSION: 1. No ovarian torsion. 2. Complex left ovarian cyst measuring 3.5 cm with lacy internal echoes. This may reflect a hemorrhagic cyst or endometrioma. Short-interval follow up ultrasound in 6-12 weeks is recommended, preferably during the week following the patient's normal menses. 3. Tubular vascular structures in both adnexa. On the right there is an adjacent heterogeneous structure with both cystic and echogenic components. Findings are concerning for pelvic inflammatory disease with possible tubo-ovarian abscess on the right. Electronically Signed   By: Rubye Oaks M.D.   On: 05/02/2017 20:38   US Pelvis Complete  Result Date: 05/02/2017 CLINICAL DATA:  Left adnexal pain. EXAM: TRANSABDOMINAL AND TRANSVAGINAL ULTRASOUND OF PELVIS DOPPLER ULTRASOUND OF OVARIES TECHNIQUE: Both transabdominal and transvaginal ultrasound examinations of the pelvis were performed. Transabdominal technique was performed for global imaging of the pelvis including uterus, ovaries, adnexal regions, and pelvic cul-de-sac. It was necessary to proceed with endovaginal exam following the transabdominal exam to visualize the endometrium, ovaries and adnexa. Color and duplex Doppler ultrasound was utilized to evaluate blood flow to the ovaries. COMPARISON:  CT 04/20/2016 FINDINGS: Uterus Measurements: 7.1 x 3.8 x 4.6 cm. No fibroids or other mass visualized. Small nabothian cyst in the cervix. Endometrium Thickness: 6.4 mm.  No focal abnormality visualized. Right ovary Measurements: 3.1 x 2.3 x 2.5 cm. There is normal  blood flow on the right ovary. In the right adnexa is a complex mixed echogenic structure with both fluid and echogenic components, appears adjacent to but separate from the right ovary. This is adjacent to a tubular vascular structure. Left ovary Measurements: 4.3 x 2.9 x 3.0 cm. Within the left ovary is a complex cyst measuring 3.7 x 2.5 x 3.0 cm with lacy internal echoes. There is blood flow to the ovarian parenchyma. Adjacent to the ovary is a tubular vascular structure. Pulsed Doppler evaluation of both ovaries demonstrates normal low-resistance arterial and venous waveforms. Other findings No abnormal free fluid. IMPRESSION: 1. No ovarian torsion. 2. Complex left ovarian cyst measuring 3.5 cm with lacy internal echoes. This may reflect a hemorrhagic cyst or endometrioma. Short-interval follow up ultrasound in 6-12 weeks is recommended, preferably during the week  following the patient's normal menses. 3. Tubular vascular structures in both adnexa. On the right there is an adjacent heterogeneous structure with both cystic and echogenic components. Findings are concerning for pelvic inflammatory disease with possible tubo-ovarian abscess on the right. Electronically Signed   By: Rubye Oaks M.D.   On: 05/02/2017 20:38   [place] Art/ven Flow Abd Pelv Doppler  Result Date: 05/02/2017 CLINICAL DATA:  Left adnexal pain. EXAM: TRANSABDOMINAL AND TRANSVAGINAL ULTRASOUND OF PELVIS DOPPLER ULTRASOUND OF OVARIES TECHNIQUE: Both transabdominal and transvaginal ultrasound examinations of the pelvis were performed. Transabdominal technique was performed for global imaging of the pelvis including uterus, ovaries, adnexal regions, and pelvic cul-de-sac. It was necessary to proceed with endovaginal exam following the transabdominal exam to visualize the endometrium, ovaries and adnexa. Color and duplex Doppler ultrasound was utilized to evaluate blood flow to the ovaries. COMPARISON:  CT 04/20/2016 FINDINGS: Uterus  Measurements: 7.1 x 3.8 x 4.6 cm. No fibroids or other mass visualized. Small nabothian cyst in the cervix. Endometrium Thickness: 6.4 mm.  No focal abnormality visualized. Right ovary Measurements: 3.1 x 2.3 x 2.5 cm. There is normal blood flow on the right ovary. In the right adnexa is a complex mixed echogenic structure with both fluid and echogenic components, appears adjacent to but separate from the right ovary. This is adjacent to a tubular vascular structure. Left ovary Measurements: 4.3 x 2.9 x 3.0 cm. Within the left ovary is a complex cyst measuring 3.7 x 2.5 x 3.0 cm with lacy internal echoes. There is blood flow to the ovarian parenchyma. Adjacent to the ovary is a tubular vascular structure. Pulsed Doppler evaluation of both ovaries demonstrates normal low-resistance arterial and venous waveforms. Other findings No abnormal free fluid. IMPRESSION: 1. No ovarian torsion. 2. Complex left ovarian cyst measuring 3.5 cm with lacy internal echoes. This may reflect a hemorrhagic cyst or endometrioma. Short-interval follow up ultrasound in 6-12 weeks is recommended, preferably during the week following the patient's normal menses. 3. Tubular vascular structures in both adnexa. On the right there is an adjacent heterogeneous structure with both cystic and echogenic components. Findings are concerning for pelvic inflammatory disease with possible tubo-ovarian abscess on the right. Electronically Signed   By: Rubye Oaks M.D.   On: 05/02/2017 20:38     Assessment   26 y.o. No obstetric history on file. Hospital Day: 3  Improved with Antibiotics, IV hydration and rest. Toradol has relieved pain. Afebrile.   Plan   1. DC home to rest. 2. Out of work till 05/09/17. 3. FU in 2 weeks at [facility] with Dr Elesa Massed. Pt also requires an [place] in 6-12 weeks.

## 2017-05-04 NOTE — Discharge Instructions (Signed)
Pelvic Inflammatory Disease Pelvic inflammatory disease (PID) refers to an infection in some or all of the female organs. The infection can be in the uterus, ovaries, fallopian tubes, or the surrounding tissues in the pelvis. PID can cause abdominal or pelvic pain that comes on suddenly (acute pelvic pain). PID is a serious infection because it can lead to lasting (chronic) pelvic pain or the inability to have children (infertility). What are the causes? This condition is most often caused by an infection that is spread during sexual contact. However, the infection can also be caused by the normal bacteria that are found in the vaginal tissues if these bacteria travel upward into the reproductive organs. PID can also occur following:  The birth of a baby.  A miscarriage.  An abortion.  Major pelvic surgery.  The use of an intrauterine device (IUD).  A sexual assault. What increases the risk? This condition is more likely to develop in women who:  Are younger than 28 years of age.  Are sexually active at ayoung age.  Use nonbarrier contraception.  Have multiple sexual partners.  Have sex with someone who has symptoms of an STD (sexually transmitted disease).  Use oral contraception. At times, certain behaviors can also increase the possibility of getting PID, such as:  Using a vaginal douche.  Having an IUD in place. What are the signs or symptoms? Symptoms of this condition include:  Abdominal or pelvic pain.  Fever.  Chills.  Abnormal vaginal discharge.  Abnormal uterine bleeding.  Unusual pain shortly after the end of a menstrual period.  Painful urination.  Pain with sexual intercourse.  Nausea and vomiting. How is this diagnosed? To diagnose this condition, your health care provider will do a physical exam and take your medical history. A pelvic exam typically reveals great tenderness in the uterus and the surrounding pelvic tissues. You may also have  tests, such as:  Lab tests, including a pregnancy test, blood tests, and urine test.  Culture tests of the vagina and cervix to check for an STD.  Ultrasound.  A laparoscopic procedure to look inside the pelvis.  Examining vaginal secretions under a microscope. How is this treated? Treatment for this condition may involve one or more approaches.  Antibiotic medicines may be prescribed to be taken by mouth.  Sexual partners may need to be treated if the infection is caused by an STD.  For more severe cases, hospitalization may be needed to give antibiotics directly into a vein through an IV tube.  Surgery may be needed if other treatments do not help, but this is rare. It may take weeks until you are completely well. If you are diagnosed with PID, you should also be checked for human immunodeficiency virus (HIV). Your health care provider may test you for infection again 3 months after treatment. You should not have unprotected sex. Follow these instructions at home:  Take over-the-counter and prescription medicines only as told by your health care provider.  If you were prescribed an antibiotic medicine, take it as told by your health care provider. Do not stop taking the antibiotic even if you start to feel better.  Do not have sexual intercourse until treatment is completed or as told by your health care provider. If PID is confirmed, your recent sexual partners will need treatment, especially if you had unprotected sex.  Keep all follow-up visits as told by your health care provider. This is important. Contact a health care provider if:  You have increased   or abnormal vaginal discharge.  Your pain does not improve.  You vomit.  You have a fever.  You cannot tolerate your medicines.  Your partner has an STD.  You have pain when you urinate. Get help right away if:  You have increased abdominal or pelvic pain.  You have chills.  Your symptoms are not better in 72  hours even with treatment. This information is not intended to replace advice given to you by your health care provider. Make sure you discuss any questions you have with your health care provider. Document Released: 12/10/2005 Document Revised: 05/17/2016 Document Reviewed: 01/17/2015 Elsevier Interactive Patient Education  2017 [employer]  

## 2017-05-04 NOTE — Progress Notes (Addendum)
RN took out patient's IV for discharge and noticed infiltration, slight redness, and tightness/swelling around IV site. Patient states that it is not very painful, "just sore." Patient states that IV doxycycline received last night burned but that Cefoxitin received this morning felt "fine." RN squeezed out all possible excess fluid from site . RN asked a second RN to also assess site. RN spoke to Clydie BraunKaren in the pharmacy to see if any given IV medications would cause necrosis or irritation of the tissue. Clydie BraunKaren stated that doxycyline could cause some tissue irritation but the other meds (Cefoxitin and toradol) should not cause a problem. RN called CNM, Milon Scorearon Jones, to update about site, conversation with pharmacy, and ask about treatment. CNM stated that patient should do "warm compresses" at home and that it may take a few days to heal. Also, that patient should call the office with any concerns or worsening symptoms from site. RN updated patient about conversation with pharmacy and CNM. Patient verbalizes understanding. Patient offered wheelchair for discharge but requested to walk downstairs. Prescriptions given to patient. Patient discharged at 11:15.

## 2017-05-04 NOTE — Discharge Summary (Signed)
PATIENJulious Francis: Alexis Francis#[id number]  DATE ADMITTED: 05/02/17  DATE DISCHARGED: 05/04/17  ADMITTING DIAGNOSIS: TBO/PID  DISCHARGE DIAGNOSIS: TBO of Rt, Lt ovarina complex cyst and PID with neg GC/CH  SECONDARY DIAGNOSIS: LLQ PAIN  BRIEF HOSPITAL COURSE: Alexis Francis presented to the ER on 05/02/17 with mod amt of pain L>R, low fever of Tmax of 100.6, slight chills, and feeling ill for 3 days. Pt presented to the ER on 05/02/17 due to worsening of S/S.  PHYSICAL EXAM: 26 yo black female in NAD today, Afebrile, no chills, no N,V,D. Eating well and voiding well.  Gen: A,A&Ox3 HEENT: Normocephalic, Eyes non-icteric. Cardiac: S1S2, RRR, No M/R/G. Lungs: CTA bilat, no W/R/R. ABD: soft, minimal tenderness over the Lt quadrant, +BS 4 quads, no organomegaly, no hernia Extrems: MAE well, no edema, peripheral pulses intact.   VITAL SIGNS:  Vitals:   05/04/17 0314 05/04/17 0833  BP: 127/73 121/74  Pulse: 83 77  Resp: 16 17  Temp: 99.4 F (37.4 C) 98.4 F (36.9 C)   LABS:  WBC 12.8, H&H 11.7/13.3, Plts: 228, GC/CH neg, urine: 1+leuk esterase, Neg nitrites, Neg ketones, Ph 8, Prot 30, SG: 1.027,   [facility] Elesa MassedWard, Pt needs US F/U in 6-12 weeks.  DISCHARGE DISPOSTION: Home  Sharee Pimplearon W. Simara Rhyner, RN, MSN, CNM,. FNP

## 2017-07-08 ENCOUNTER — Emergency Department
Admission: EM | Admit: 2017-07-08 | Discharge: 2017-07-08 | Disposition: A | Discharge status: Home / Self Care | Payer: Self-pay | Attending: Emergency Medicine | Admitting: Emergency Medicine

## 2017-07-08 ENCOUNTER — Encounter: Payer: Self-pay | Admitting: Emergency Medicine

## 2017-07-08 DIAGNOSIS — Z87891 Personal history of nicotine dependence: Secondary | ICD-10-CM | POA: Insufficient documentation

## 2017-07-08 DIAGNOSIS — N898 Other specified noninflammatory disorders of vagina: Secondary | ICD-10-CM | POA: Insufficient documentation

## 2017-07-08 DIAGNOSIS — B9689 Other specified bacterial agents as the cause of diseases classified elsewhere: Secondary | ICD-10-CM

## 2017-07-08 DIAGNOSIS — N760 Acute vaginitis: Secondary | ICD-10-CM

## 2017-07-08 LAB — URINALYSIS, COMPLETE (UACMP) WITH MICROSCOPIC
Bacteria, UA: NONE SEEN
Bilirubin Urine: NEGATIVE
Glucose, UA: NEGATIVE mg/dL
Hgb urine dipstick: NEGATIVE
KETONES UR: NEGATIVE mg/dL
Leukocytes, UA: NEGATIVE
Nitrite: NEGATIVE
PROTEIN: NEGATIVE mg/dL
Specific Gravity, Urine: 1.016 (ref 1.005–1.030)
pH: 6 (ref 5.0–8.0)

## 2017-07-08 LAB — COMPREHENSIVE METABOLIC PANEL
ALBUMIN: 3.9 g/dL (ref 3.5–5.0)
ALT: 12 U/L — AB (ref 14–54)
ANION GAP: 6 (ref 5–15)
AST: 15 U/L (ref 15–41)
Alkaline Phosphatase: 47 U/L (ref 38–126)
BUN: 9 mg/dL (ref 6–20)
CHLORIDE: 108 mmol/L (ref 101–111)
CO2: 25 mmol/L (ref 22–32)
CREATININE: 0.9 mg/dL (ref 0.44–1.00)
Calcium: 9.2 mg/dL (ref 8.9–10.3)
GFR calc non Af Amer: >60 mL/min (ref >60)
Glucose, Bld: 105 mg/dL — ABNORMAL HIGH (ref 65–99)
Potassium: 3.8 mmol/L (ref 3.5–5.1)
SODIUM: 139 mmol/L (ref 135–145)
Total Bilirubin: 0.7 mg/dL (ref 0.3–1.2)
Total Protein: 7.3 g/dL (ref 6.5–8.1)

## 2017-07-08 LAB — CBC
HCT: 38.3 % (ref 35.0–47.0)
HEMOGLOBIN: 13 g/dL (ref 12.0–16.0)
MCH: 31.7 pg (ref 26.0–34.0)
MCHC: 33.9 g/dL (ref 32.0–36.0)
MCV: 93.5 fL (ref 80.0–100.0)
Platelets: 297 10*3/uL (ref 150–440)
RBC: 4.1 MIL/uL (ref 3.80–5.20)
RDW: 13.4 % (ref 11.5–14.5)
WBC: 4.4 10*3/uL (ref 3.6–11.0)

## 2017-07-08 LAB — LIPASE, BLOOD: LIPASE: 25 U/L (ref 11–51)

## 2017-07-08 LAB — CHLAMYDIA/NGC RT PCR (ARMC ONLY)
Chlamydia Tr: NOT DETECTED
N gonorrhoeae: NOT DETECTED

## 2017-07-08 LAB — POCT PREGNANCY, URINE: Preg Test, Ur: NEGATIVE

## 2017-07-08 LAB — WET PREP, GENITAL
Sperm: NONE SEEN
TRICH WET PREP: NONE SEEN
YEAST WET PREP: NONE SEEN

## 2017-07-08 MED ORDER — METRONIDAZOLE 500 MG PO TABS: 500.0000 mg | ORAL_TABLET | Freq: Two times a day (BID) | ORAL | 0 refills | Status: AC

## 2017-07-08 NOTE — ED Triage Notes (Signed)
Pt reports lower abdominal pain and nausea that began one week ago. Pt reports approximately one month ago was seen here and treated for infection in fallopian tube. Pt states this feeling is the same as before.

## 2017-07-08 NOTE — ED Provider Notes (Signed)
[facility] Emergency Department Provider Note  Time seen: 1:49 PM  I have reviewed the triage vital signs and the nursing notes.   HISTORY  Chief Complaint Abdominal Pain    HPI Alexis Francis is a 26 y.o. female with a past medical history of PID diagnosed 2 months ago who presents to the emergency department for vaginal discharge. According to the patient 2 months ago she was having vaginal discharge diagnosis of pelvic inflammatory disease was placed on antibiotics which she completed. She states approximately one week ago she once again started with vaginal discharge which she states is foul-smelling with mild lower abdominal discomfort. Denies any fever. Denies nausea vomiting or diarrhea. Denies dysuria.  History reviewed. No pertinent past medical history.  Patient Active Problem List   Diagnosis Date Noted  . PID (acute pelvic inflammatory disease) 05/02/2017    No past surgical history on file.  Prior to Admission medications   Medication Sig Start Date End Date Taking? Authorizing Provider  doxycycline (MONODOX) 100 MG capsule Take 1 capsule (100 mg total) by mouth 2 (two) times daily. Patient not taking: Reported on 07/08/2017 05/04/17   Sharee Pimple, CNM  traMADol (ULTRAM) 50 MG tablet Take 1 tablet (50 mg total) by mouth every 6 (six) hours as needed for moderate pain. Patient not taking: Reported on 07/08/2017 05/04/17   Sharee Pimple, CNM    No Known Allergies  No family history on file.  Social History Social History  Substance Use Topics  . Smoking status: Current Every Day Smoker  . Smokeless tobacco: Never Used  . Alcohol use No    Review of Systems Constitutional: Negative for fever Cardiovascular: Negative for chest pain. Respiratory: Negative for shortness of breath. Gastrointestinal:Mild lower abdominal discomfort. Negative for nausea vomiting or diarrhea. Genitourinary: Negative for dysuria. Positive for vaginal  discharge. Musculoskeletal: Negative for back pain. Neurological: Negative for headache All other ROS negative  ____________________________________________   PHYSICAL EXAM:  VITAL SIGNS: ED Triage Vitals  Enc Vitals Group     BP 07/08/17 1058 127/67     Pulse Rate 07/08/17 1058 79     Resp 07/08/17 1058 18     Temp 07/08/17 1058 98.5 F (36.9 C)     Temp Source 07/08/17 1058 Oral     SpO2 07/08/17 1058 100 %     Weight 07/08/17 1059 250 lb (113.4 kg)     Height 07/08/17 1059 5\' 5"  (1.651 m)     Head Circumference --      Peak Flow --      Pain Score 07/08/17 1058 4     Pain Loc --      Pain Edu? --      Excl. in GC? --     Constitutional: Alert and oriented. Well appearing and in no distress. Eyes: Normal exam ENT   Head: Normocephalic and atraumatic.   Mouth/Throat: Mucous membranes are moist. Cardiovascular: Normal rate, regular rhythm. No murmur Respiratory: Normal respiratory effort without tachypnea nor retractions. Breath sounds are clear  Gastrointestinal: Soft, minimal suprapubic tenderness to palpation without rebound or guarding. No distention. Musculoskeletal: Nontender with normal range of motion in all extremities.  Neurologic:  Normal speech and language. No gross focal neurologic deficits Skin:  Skin is warm, dry and intact.  Psychiatric: Mood and affect are normal.   ____________________________________________   INITIAL IMPRESSION / ASSESSMENT AND PLAN / ED COURSE  Pertinent labs & imaging results that were available during my  care of the patient were reviewed by me and considered in my medical decision making (see chart for details).  The patient presents to the emergency department for vaginal discharge times one week approximately 2 months after a PID diagnosis. Patient states she took off her antibiotics and the discharge resolved however one week ago it restarted. States her partner was never treated. Patient's chlamydia and gonorrhea  testing in May were negative at the time of her diagnosis. We will perform a pelvic exam and send swabs. Patient's labs are normal today. With a normal white blood cell count.   Wet prep positive for BV. We will discharge for Flagyl and PCP follow-up. Pelvic exam showed a mild amount of discharge no adnexal or cervical motion tenderness.  ____________________________________________   FINAL CLINICAL IMPRESSION(S) / ED DIAGNOSES  Vaginal discharge Bacterial vaginitis   Minna AntisPaduchowski, Jamarl Pew, MD 07/08/17 1535

## 2017-08-01 ENCOUNTER — Encounter: Payer: Self-pay | Admitting: Medical Oncology

## 2017-08-01 ENCOUNTER — Emergency Department
Admission: EM | Admit: 2017-08-01 | Discharge: 2017-08-01 | Disposition: A | Discharge status: Home / Self Care | Payer: Self-pay | Attending: Emergency Medicine | Admitting: Emergency Medicine

## 2017-08-01 DIAGNOSIS — G8929 Other chronic pain: Secondary | ICD-10-CM

## 2017-08-01 DIAGNOSIS — F17200 Nicotine dependence, unspecified, uncomplicated: Secondary | ICD-10-CM | POA: Insufficient documentation

## 2017-08-01 DIAGNOSIS — M545 Low back pain: Secondary | ICD-10-CM | POA: Insufficient documentation

## 2017-08-01 MED ORDER — CYCLOBENZAPRINE HCL 10 MG PO TABS: 10.0000 mg | ORAL_TABLET | Freq: Three times a day (TID) | ORAL | 0 refills | Status: DC | PRN

## 2017-08-01 MED ORDER — IBUPROFEN 800 MG PO TABS: 800.0000 mg | ORAL_TABLET | Freq: Three times a day (TID) | ORAL | 0 refills | Status: DC | PRN

## 2017-08-01 NOTE — ED Provider Notes (Signed)
[facility] Emergency Department Provider Note   ____________________________________________   First MD Initiated Contact with Patient 08/01/17 [phone]     (approximate)  I have reviewed the triage vital signs and the nursing notes.   HISTORY  Chief Complaint Back Pain    HPI Alexis Francis is a 26 y.o. female patient complaining of chronic low back pain which is worse in the past 2 days. Patient denies any specific incident. Patient said jaw for quite repetitive lifting. He denies radicular component to this back pain. Patient denies any bladder or bowel dysfunction.Patient rates pain as 8/10. Patient described a pain as "achy". No palliative measures for complaint.   History reviewed. No pertinent past medical history.  Patient Active Problem List   Diagnosis Date Noted  . PID (acute pelvic inflammatory disease) 05/02/2017    History reviewed. No pertinent surgical history.  Prior to Admission medications   Medication Sig Start Date End Date Taking? Authorizing Provider  cyclobenzaprine (FLEXERIL) 10 MG tablet Take 1 tablet (10 mg total) by mouth 3 (three) times daily as needed. 08/01/17   Joni ReiningSmith, Ronald K, PA-C  doxycycline (MONODOX) 100 MG capsule Take 1 capsule (100 mg total) by mouth 2 (two) times daily. Patient not taking: Reported on 07/08/2017 05/04/17   Sharee PimpleJones, Caron W, CNM  ibuprofen (ADVIL,MOTRIN) 800 MG tablet Take 1 tablet (800 mg total) by mouth every 8 (eight) hours as needed for moderate pain. 08/01/17   Joni ReiningSmith, Ronald K, PA-C  traMADol (ULTRAM) 50 MG tablet Take 1 tablet (50 mg total) by mouth every 6 (six) hours as needed for moderate pain. Patient not taking: Reported on 07/08/2017 05/04/17   Sharee PimpleJones, Caron W, CNM    Allergies Patient has no known allergies.  No family history on file.  Social History Social History  Substance Use Topics  . Smoking status: Current Every Day Smoker  . Smokeless tobacco: Never Used  . Alcohol use No     Review of Systems  Constitutional: No fever/chills. Eyes: No visual changes. ENT: No sore throat. Cardiovascular: Denies chest pain. Respiratory: Denies shortness of breath. Gastrointestinal: No abdominal pain.  No nausea, no vomiting.  No diarrhea.  No constipation. Genitourinary: Negative for dysuria. Musculoskeletal: Positive for back pain. Skin: Negative for rash. Neurological: Negative for headaches, focal weakness or numbness.  ____________________________________________   PHYSICAL EXAM:  VITAL SIGNS: ED Triage Vitals  Enc Vitals Group     BP 08/01/17 0708 119/75     Pulse Rate 08/01/17 0708 76     Resp 08/01/17 0708 16     Temp 08/01/17 0708 98.2 F (36.8 C)     Temp Source 08/01/17 0708 Oral     SpO2 08/01/17 0708 99 %     Weight 08/01/17 0709 250 lb (113.4 kg)     Height 08/01/17 0709 5\' 5"  (1.651 m)     Head Circumference --      Peak Flow --      Pain Score 08/01/17 0708 8     Pain Loc --      Pain Edu? --      Excl. in GC? --     Constitutional: Alert and oriented. Well appearing and in no acute distress.Morbid obesity Cardiovascular: Normal rate, regular rhythm. Grossly normal heart sounds.  Good peripheral circulation. Respiratory: Normal respiratory effort.  No retractions. Lungs CTAB. Musculoskeletal: No obvious spinal deformity. Patient equal and full range of motion. Patient had mild bilateral paraspinal muscle spasm with lateral movements.  Patient unable to straight leg test. Patient had normal gait. Patient sits and stands without heavily last on upper extremities. Neurologic:  Normal speech and language. No gross focal neurologic deficits are appreciated. No gait instability. Skin:  Skin is warm, dry and intact. No rash noted. Psychiatric: Mood and affect are normal. Speech and behavior are normal.  ____________________________________________   LABS (all labs ordered are listed, but only abnormal results are displayed)  Labs Reviewed -  No data to display ____________________________________________  EKG   ____________________________________________  RADIOLOGY  No results found.  ____________________________________________   PROCEDURES  Procedure(s) performed: None  Procedures  Critical Care performed: No  ____________________________________________   INITIAL IMPRESSION / ASSESSMENT AND PLAN / ED COURSE  Pertinent labs & imaging results that were available during my care of the patient were reviewed by me and considered in my medical decision making (see chart for details).  Patient presented with increase of chronic back pain. Patient given discharge care instructions. She given a work note. Patient advised follow-up with "clinic if condition persists.    ____________________________________________   FINAL CLINICAL IMPRESSION(S) / ED DIAGNOSES  Final diagnoses:  Chronic midline low back pain without sciatica      NEW MEDICATIONS STARTED DURING THIS VISIT:  Discharge Medication List as of 08/01/2017  7:26 AM    START taking these medications   Details  cyclobenzaprine (FLEXERIL) 10 MG tablet Take 1 tablet (10 mg total) by mouth 3 (three) times daily as needed., Starting Thu 08/01/2017, Print    ibuprofen (ADVIL,MOTRIN) 800 MG tablet Take 1 tablet (800 mg total) by mouth every 8 (eight) hours as needed for moderate pain., Starting Thu 08/01/2017, Print         Note:  This document was prepared using Dragon voice recognition software and may include unintentional dictation errors.    Joni Reining, PA-C 08/01/17 1610    Phineas Semen, MD 08/01/17 [phone]

## 2017-08-01 NOTE — ED Notes (Signed)
Pt to ed with c/o back pain that started about 3 days ago, denies new injury but states chronic back pain. Pt ambulates with ease.

## 2017-08-01 NOTE — ED Triage Notes (Signed)
Pt c/o chronic back and neck pain that has began to worsen. Pt denies injury.

## 2017-09-09 ENCOUNTER — Emergency Department
Admission: EM | Admit: 2017-09-09 | Discharge: 2017-09-09 | Disposition: A | Discharge status: Home / Self Care | Payer: Self-pay | Attending: Emergency Medicine | Admitting: Emergency Medicine

## 2017-09-09 ENCOUNTER — Emergency Department: Payer: Self-pay

## 2017-09-09 ENCOUNTER — Encounter: Payer: Self-pay | Admitting: Emergency Medicine

## 2017-09-09 DIAGNOSIS — F17200 Nicotine dependence, unspecified, uncomplicated: Secondary | ICD-10-CM | POA: Insufficient documentation

## 2017-09-09 DIAGNOSIS — M545 Low back pain, unspecified: Secondary | ICD-10-CM

## 2017-09-09 DIAGNOSIS — W010XXA Fall on same level from slipping, tripping and stumbling without subsequent striking against object, initial encounter: Secondary | ICD-10-CM | POA: Insufficient documentation

## 2017-09-09 DIAGNOSIS — Y92008 Other place in unspecified non-institutional (private) residence as the place of occurrence of the external cause: Secondary | ICD-10-CM | POA: Insufficient documentation

## 2017-09-09 DIAGNOSIS — Y999 Unspecified external cause status: Secondary | ICD-10-CM | POA: Insufficient documentation

## 2017-09-09 LAB — POCT PREGNANCY, URINE: PREG TEST UR: NEGATIVE

## 2017-09-09 MED ORDER — IBUPROFEN 600 MG PO TABS: 600.0000 mg | ORAL_TABLET | Freq: Four times a day (QID) | ORAL | 0 refills | Status: DC | PRN

## 2017-09-09 MED ORDER — TRAMADOL HCL 50 MG PO TABS: 50.0000 mg | ORAL_TABLET | Freq: Four times a day (QID) | ORAL | 0 refills | Status: DC | PRN

## 2017-09-09 MED ORDER — CYCLOBENZAPRINE HCL 5 MG PO TABS: 5.0000 mg | ORAL_TABLET | Freq: Three times a day (TID) | ORAL | 0 refills | Status: AC | PRN

## 2017-09-09 MED ORDER — LIDOCAINE 5 % EX PTCH
1.0000 | MEDICATED_PATCH | CUTANEOUS | Status: DC
  Administered 2017-09-09: 1 via TRANSDERMAL
  Filled 2017-09-09: qty 1

## 2017-09-09 MED ORDER — KETOROLAC TROMETHAMINE 30 MG/ML IJ SOLN
30.0000 mg | Freq: Once | INTRAMUSCULAR | Status: AC
  Administered 2017-09-09: 30 mg via INTRAVENOUS
  Filled 2017-09-09: qty 1

## 2017-09-09 NOTE — ED Provider Notes (Signed)
[facility] Emergency Department Provider Note  ____________________________________________  Time seen: Approximately 7:40 AM  I have reviewed the triage vital signs and the nursing notes.   HISTORY  Chief Complaint Back Pain and Fall    HPI Alexis Francis is a 26 y.o. female that presents to the emergency with low back pain after falling this morning. Patient states that she tripped on a puddle of water on her porch. Pain is in the center of her low back and does not radiate. She has been walking since fall. No alleviating measures have been attempted. She has had back pain on and off for years that she has taken Tylenol for.No bowel or bladder dysfunction or saddle paresthesias. No shortness breath, chest pain, nausea, vomiting, abdominal pain, numbness, tingling.   History reviewed. No pertinent past medical history.  Patient Active Problem List   Diagnosis Date Noted  . PID (acute pelvic inflammatory disease) 05/02/2017    No past surgical history on file.  Prior to Admission medications   Medication Sig Start Date End Date Taking? Authorizing Provider  cyclobenzaprine (FLEXERIL) 5 MG tablet Take 1 tablet (5 mg total) by mouth 3 (three) times daily as needed for muscle spasms. 09/09/17 09/16/17  Enid Derry, PA-C  ibuprofen (ADVIL,MOTRIN) 600 MG tablet Take 1 tablet (600 mg total) by mouth every 6 (six) hours as needed. 09/09/17   Enid Derry, PA-C  traMADol (ULTRAM) 50 MG tablet Take 1 tablet (50 mg total) by mouth every 6 (six) hours as needed. 09/09/17 09/09/18  Enid Derry, PA-C    Allergies Patient has no known allergies.  No family history on file.  Social History Social History  Substance Use Topics  . Smoking status: Current Every Day Smoker  . Smokeless tobacco: Never Used  . Alcohol use No     Review of Systems  Cardiovascular: No chest pain. Respiratory: No SOB. Gastrointestinal: No abdominal pain.  No nausea, no  vomiting.  Musculoskeletal: Positive for back pain. Skin: Negative for rash, abrasions, lacerations, ecchymosis. Neurological: Negative for headaches, numbness or tingling   ____________________________________________   PHYSICAL EXAM:  VITAL SIGNS: ED Triage Vitals  Enc Vitals Group     BP 09/09/17 0726 122/70     Pulse Rate 09/09/17 0726 84     Resp 09/09/17 0726 16     Temp 09/09/17 0726 98.6 F (37 C)     Temp Source 09/09/17 0726 Oral     SpO2 09/09/17 0726 100 %     Weight 09/09/17 0722 250 lb (113.4 kg)     Height 09/09/17 0722  (1.651 m)     Head Circumference --      Peak Flow --      Pain Score 09/09/17 0722 8     Pain Loc --      Pain Edu? --      Excl. in GC? --      Constitutional: Alert and oriented. Well appearing and in no acute distress. Eyes: Conjunctivae are normal. PERRL. EOMI. Head: Atraumatic. ENT:      Ears:      Nose: No congestion/rhinnorhea.      Mouth/Throat: Mucous membranes are moist.  Neck: No stridor.  No cervical spine tenderness to palpation. Cardiovascular: Normal rate, regular rhythm.  Good peripheral circulation. Respiratory: Normal respiratory effort without tachypnea or retractions. Lungs CTAB. Good air entry to the bases with no decreased or absent breath sounds. Gastrointestinal: Bowel sounds 4 quadrants. Soft and nontender to palpation.  No guarding or rigidity. No palpable masses. No distention.  Musculoskeletal: Full range of motion to all extremities. No gross deformities appreciated. Tenderness to palpation over inferior lumbar spine. No tenderness to palpation over paraspinal muscles.  Full range of motion of back. Negative straight leg raise. 5 out of 5 strength in lower extremities bilaterally. Neurologic:  Normal speech and language. No gross focal neurologic deficits are appreciated.  Skin:  Skin is warm, dry and intact. No rash noted.  ____________________________________________   LABS (all labs ordered are  listed, but only abnormal results are displayed)  Labs Reviewed  POC URINE PREG, ED  POCT PREGNANCY, URINE   ____________________________________________  EKG   ____________________________________________  RADIOLOGY Lexine Baton, personally viewed and evaluated these images (plain radiographs) as part of my medical decision making, as well as reviewing the written report by the radiologist.  Dg Lumbar Spine Complete  Result Date: 09/09/2017 CLINICAL DATA:  Acute on chronic low back pain after fall today. EXAM: LUMBAR SPINE - COMPLETE 4+ VIEW COMPARISON:  CT scan of April 20, 2016. FINDINGS: There is no evidence of lumbar spine fracture. Alignment is normal. Intervertebral disc spaces are maintained. IMPRESSION: Normal lumbar spine. Electronically Signed   By: Lupita Raider, M.D.   On: 09/09/2017 08:58    ____________________________________________    PROCEDURES  Procedure(s) performed:    Procedures    Medications  lidocaine (LIDODERM) 5 % 1 patch (1 patch Transdermal Patch Applied 09/09/17 0942)  ketorolac (TORADOL) 30 MG/ML injection 30 mg (30 mg Intravenous Given 09/09/17 0942)     ____________________________________________   INITIAL IMPRESSION / ASSESSMENT AND PLAN / ED COURSE  Pertinent labs & imaging results that were available during my care of the patient were reviewed by me and considered in my medical decision making (see chart for details).  Review of the [place] CSRS was performed in accordance of the [facility] prior to dispensing any controlled drugs.     She doesn't emergency department for evaluation of low back pain after fall. Vital signs and exam are reassuring. Lumbar x-ray negative for acute bony abnormalities. Patient is able to walk without assistance. No bowel or bladder dysfunction or saddle paresthesias. She felt better after Toradol and Lidoderm. She is requesting a work note. Patient will be discharged home with prescriptions for ibuprofen,  Flexeril, a short course of tramadol. Patient is to follow up with PCP as directed. Patient is given ED precautions to return to the ED for any worsening or new symptoms.     ____________________________________________  FINAL CLINICAL IMPRESSION(S) / ED DIAGNOSES  Final diagnoses:  Acute midline low back pain without sciatica      NEW MEDICATIONS STARTED DURING THIS VISIT:  Discharge Medication List as of 09/09/2017 10:12 AM          This chart was dictated using voice recognition software/Dragon. Despite best efforts to proofread, errors can occur which can change the meaning. Any change was purely unintentional.    Enid Derry, PA-C 09/09/17 1030    Emily Filbert, MD 09/09/17 1106

## 2017-09-09 NOTE — ED Notes (Signed)
See triage note  States she fell this am landed on right side  Twisted her back  Having pain to mid back and right hip area  Ambulates well to treatment room

## 2017-09-09 NOTE — ED Triage Notes (Signed)
Pt reports chronic back pain. Denies any new symptoms or increased pain. Denies NVD, denies dysuria. Pt ambulatory to triage, no apparent distress noted.

## 2017-10-19 ENCOUNTER — Emergency Department
Admission: EM | Admit: 2017-10-19 | Discharge: 2017-10-19 | Disposition: A | Discharge status: Home / Self Care | Payer: Self-pay | Attending: Emergency Medicine | Admitting: Emergency Medicine

## 2017-10-19 ENCOUNTER — Encounter: Payer: Self-pay | Admitting: Emergency Medicine

## 2017-10-19 DIAGNOSIS — M546 Pain in thoracic spine: Secondary | ICD-10-CM | POA: Insufficient documentation

## 2017-10-19 DIAGNOSIS — F17200 Nicotine dependence, unspecified, uncomplicated: Secondary | ICD-10-CM | POA: Insufficient documentation

## 2017-10-19 LAB — URINALYSIS, COMPLETE (UACMP) WITH MICROSCOPIC
BILIRUBIN URINE: NEGATIVE
Bacteria, UA: NONE SEEN
Glucose, UA: NEGATIVE mg/dL
HGB URINE DIPSTICK: NEGATIVE
KETONES UR: NEGATIVE mg/dL
LEUKOCYTES UA: NEGATIVE
Nitrite: POSITIVE — AB
Protein, ur: NEGATIVE mg/dL
Specific Gravity, Urine: 1.016 (ref 1.005–1.030)
pH: 6 (ref 5.0–8.0)

## 2017-10-19 LAB — POCT PREGNANCY, URINE: PREG TEST UR: NEGATIVE

## 2017-10-19 MED ORDER — KETOROLAC TROMETHAMINE 10 MG PO TABS: 10.0000 mg | ORAL_TABLET | Freq: Four times a day (QID) | ORAL | 0 refills | Status: DC | PRN

## 2017-10-19 MED ORDER — KETOROLAC TROMETHAMINE 30 MG/ML IJ SOLN
30.0000 mg | Freq: Once | INTRAMUSCULAR | Status: AC
  Administered 2017-10-19: 30 mg via INTRAMUSCULAR
  Filled 2017-10-19: qty 1

## 2017-10-19 NOTE — ED Triage Notes (Signed)
Pt arrives POV to triage with c/o lower right back pain x 1 week. Pt is ambulatory to triage with NAD.

## 2017-10-19 NOTE — ED Provider Notes (Signed)
[facility] Emergency Department Provider Note   ____________________________________________   First MD Initiated Contact with Patient 10/19/17 0715     (approximate)  I have reviewed the triage vital signs and the nursing notes.   HISTORY  Chief Complaint Back Pain  HPI Alexis Francis is a 26 y.o. female is here with complaint of right mid back pain for one week. Patient denies any fever or chills. She complains of dysuria and vaginal discharge. Patient states that she was seen 2 days ago at the health department and checked for STDs. She has been taking Azo over-the-counter without any relief of her symptoms. She also has had a history of chronic back pain. She denies any recent injury to her back.he rates her pain as 7 out of 10.  History reviewed. No pertinent past medical history.  Patient Active Problem List   Diagnosis Date Noted  . PID (acute pelvic inflammatory disease) 05/02/2017    History reviewed. No pertinent surgical history.  Prior to Admission medications   Medication Sig Start Date End Date Taking? Authorizing Provider  ketorolac (TORADOL) 10 MG tablet Take 1 tablet (10 mg total) by mouth every 6 (six) hours as needed for moderate pain. 10/19/17   Tommi RumpsSummers, Rhonda L, PA-C    Allergies Patient has no known allergies.  No family history on file.  Social History Social History  Substance Use Topics  . Smoking status: Current Every Day Smoker  . Smokeless tobacco: Never Used  . Alcohol use No    Review of Systems Constitutional: No fever/chills Cardiovascular: Denies chest pain. Respiratory: Denies shortness of breath. Gastrointestinal: No abdominal pain.  No nausea, no vomiting.  No diarrhea.  Genitourinary: positive for frequency. Musculoskeletal: positive for right back pain. Skin: Negative for rash. Neurological: Negative for  focal weakness or numbness. ____________________________________________   PHYSICAL  EXAM:  VITAL SIGNS: ED Triage Vitals  Enc Vitals Group     BP 10/19/17 0640 126/67     Pulse Rate 10/19/17 0640 92     Resp 10/19/17 0640 18     Temp 10/19/17 0640 98.7 F (37.1 C)     Temp Source 10/19/17 0640 Oral     SpO2 10/19/17 0640 96 %     Weight 10/19/17 0640 250 lb (113.4 kg)     Height 10/19/17 0640 5\' 5"  (1.651 m)     Head Circumference --      Peak Flow --      Pain Score 10/19/17 0639 7     Pain Loc --      Pain Edu? --      Excl. in GC? --    Constitutional: Alert and oriented. Well appearing and in no acute distress. Eyes: Conjunctivae are normal.  Head: Atraumatic. Nose: No congestion/rhinnorhea. Neck: No stridor.   Cardiovascular: Normal rate, regular rhythm. Grossly normal heart sounds.  Good peripheral circulation. Respiratory: Normal respiratory effort.  No retractions. Lungs CTAB. Gastrointestinal: Soft and nontender. No distention. No CVA tenderness. Musculoskeletal: on examination of the back there is no gross deformity. There is some minimal tenderness appreciated over the right flank area. No muscle spasms were seen. Range of motion is not restricted. Straight leg raises were negative.Gait was noted. Neurologic:  Normal speech and language. No gross focal neurologic deficits are appreciated.  Skin:  Skin is warm, dry and intact. No rash noted. Psychiatric: Mood and affect are normal. Speech and behavior are normal.  ____________________________________________   LABS (all labs ordered are  listed, but only abnormal results are displayed)  Labs Reviewed  URINALYSIS, COMPLETE (UACMP) WITH MICROSCOPIC - Abnormal; Notable for the following:       Result Value   Color, Urine AMBER (*)    APPearance CLEAR (*)    Nitrite POSITIVE (*)    Squamous Epithelial / LPF 0-5 (*)    All other components within normal limits  POCT PREGNANCY, URINE    PROCEDURES  Procedure(s) performed: None  Procedures  Critical Care performed:  No  ____________________________________________   INITIAL IMPRESSION / ASSESSMENT AND PLAN / ED COURSE Patient was given Toradol IM while in the department and received relief from her back pain. Patient was given a prescription for Toradol 10 mg tablets 1 every 6 hours #12. Patient is encouraged to use ice or heat to her back and to establish a PCP. She was given a list of clinics in the area [facility]. ___________________________________________   FINAL CLINICAL IMPRESSION(S) / ED DIAGNOSES  Final diagnoses:  Acute right-sided thoracic back pain      NEW MEDICATIONS STARTED DURING THIS VISIT:  Discharge Medication List as of 10/19/2017  8:46 AM    START taking these medications   Details  ketorolac (TORADOL) 10 MG tablet Take 1 tablet (10 mg total) by mouth every 6 (six) hours as needed for moderate pain., Starting Sat 10/19/2017, Print         Note:  This document was prepared using Dragon voice recognition software and may include unintentional dictation errors.    Tommi Rumps, PA-C 10/19/17 1126    Merrily Brittle, MD 10/19/17 [phone]

## 2017-10-19 NOTE — Discharge Instructions (Signed)
Call all the clinics listed on your discharge papers to get established as a patient. These clinics are based on your income except for [facility] which [facility]. Begin taking Toradol every 6 hours with food for the next 3 days. Ice or heat to your back. Increase fluids.

## 2017-12-11 ENCOUNTER — Emergency Department
Admission: EM | Admit: 2017-12-11 | Discharge: 2017-12-11 | Disposition: A | Discharge status: Home / Self Care | Payer: Self-pay | Attending: Emergency Medicine | Admitting: Emergency Medicine

## 2017-12-11 ENCOUNTER — Encounter: Payer: Self-pay | Admitting: Emergency Medicine

## 2017-12-11 DIAGNOSIS — M778 Other enthesopathies, not elsewhere classified: Secondary | ICD-10-CM

## 2017-12-11 DIAGNOSIS — F17200 Nicotine dependence, unspecified, uncomplicated: Secondary | ICD-10-CM | POA: Insufficient documentation

## 2017-12-11 DIAGNOSIS — M70821 Other soft tissue disorders related to use, overuse and pressure, right upper arm: Secondary | ICD-10-CM | POA: Insufficient documentation

## 2017-12-11 DIAGNOSIS — S46911A Strain of unspecified muscle, fascia and tendon at shoulder and upper arm level, right arm, initial encounter: Secondary | ICD-10-CM

## 2017-12-11 DIAGNOSIS — Z79899 Other long term (current) drug therapy: Secondary | ICD-10-CM | POA: Insufficient documentation

## 2017-12-11 DIAGNOSIS — X503XXA Overexertion from repetitive movements, initial encounter: Secondary | ICD-10-CM | POA: Insufficient documentation

## 2017-12-11 DIAGNOSIS — Y9389 Activity, other specified: Secondary | ICD-10-CM | POA: Insufficient documentation

## 2017-12-11 DIAGNOSIS — M779 Enthesopathy, unspecified: Secondary | ICD-10-CM

## 2017-12-11 MED ORDER — CYCLOBENZAPRINE HCL 5 MG PO TABS
5.0000 mg | ORAL_TABLET | Freq: Three times a day (TID) | ORAL | 0 refills | Status: DC | PRN
Start: 2017-12-11 — End: 2018-07-20

## 2017-12-11 MED ORDER — NAPROXEN 500 MG PO TABS: 500.0000 mg | ORAL_TABLET | Freq: Two times a day (BID) | ORAL | 0 refills | Status: AC

## 2017-12-11 NOTE — ED Notes (Signed)
Patient states she does not want to file workers comp.

## 2017-12-11 NOTE — Discharge Instructions (Signed)
Your exam is consistent with a muscle strain and some mild tendinitis. Take the prescription meds as directed. Apply ice to reduce pain and swelling. Perform the range of motion exercises as demonstrated. Follow-up with a local community clinic for continued symptoms.

## 2017-12-11 NOTE — ED Provider Notes (Signed)
[facility] Emergency Department Provider Note ____________________________________________  Time seen: 1021  I have reviewed the triage vital signs and the nursing notes.  HISTORY  Chief Complaint  Shoulder Pain  HPI Alexis Francis is a 26 y.o.right-handed female presents herself to the ED for evaluation of right shoulder pain.  Patient describes yesterday while at work she began to experience increased right shoulder pain and stiffness.  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  She denies any distal paresthesias, grip changes, or chest pain.  She does note some swelling and tightness to the right hand as well.  She denies any previous history of ongoing or chronic shoulder pain.  No alleviating measures have been attempted.  History reviewed. No pertinent past medical history.  Patient Active Problem List   Diagnosis Date Noted  . PID (acute pelvic inflammatory disease) 05/02/2017    History reviewed. No pertinent surgical history.  Prior to Admission medications   Medication Sig Start Date End Date Taking? Authorizing Provider  cyclobenzaprine (FLEXERIL) 5 MG tablet Take 1 tablet (5 mg total) by mouth 3 (three) times daily as needed for muscle spasms. 12/11/17   Yarielys Beed, Charlesetta IvoryJenise V Bacon, PA-C  ketorolac (TORADOL) 10 MG tablet Take 1 tablet (10 mg total) by mouth every 6 (six) hours as needed for moderate pain. 10/19/17   Tommi RumpsSummers, Rhonda L, PA-C  naproxen (NAPROSYN) 500 MG tablet Take 1 tablet (500 mg total) by mouth 2 (two) times daily with a meal for 8 days. 12/11/17 12/19/17  Mayra Jolliffe, Charlesetta IvoryJenise V Bacon, PA-C   Allergies Patient has no known allergies.  No family history on file.  Social History Social  History   Tobacco Use  . Smoking status: Current Every Day Smoker  . Smokeless tobacco: Never Used  Substance Use Topics  . Alcohol use: No  . Drug use: No    Review of Systems  Constitutional: Negative for fever. Cardiovascular: Negative for chest pain. Respiratory: Negative for shortness of breath. Musculoskeletal: Negative for back pain. Right shoulder pain and stiffness as above Skin: Negative for rash. Neurological: Negative for headaches, focal weakness or numbness. ____________________________________________  PHYSICAL EXAM:  VITAL SIGNS: ED Triage Vitals [12/11/17 1004]  Enc Vitals Group     BP 117/78     Pulse Rate 90     Resp 16     Temp 97.8 F (36.6 C)     Temp src      SpO2 100 %     Weight 250 lb (113.4 kg)     Height 5\' 5"  (1.651 m)     Head Circumference      Peak Flow      Pain Score 6     Pain Loc      Pain Edu?      Excl. in GC?     Constitutional: Alert and oriented. Well appearing and in no distress. Head: Normocephalic and atraumatic. Neck: Supple. No thyromegaly. Cardiovascular: Normal rate, regular rhythm. Normal distal pulses.  Normal capillary refill. Respiratory: Normal respiratory effort. No wheezes/rales/rhonchi. Musculoskeletal: Right shoulder without any obvious deformity, dislocation, or sulcus sign.  Patient without tenderness to palpation over the primary bony processes.  She has normal active range of motion with  flexion and extension.  Normal rotator cuff testing and resistance with exam.  Patient does show some pain with isolated triceps muscle resistance testing.  She is also tender to palpation to the posterior shoulder region at the triceps origin.  Normal composite fist.  Nontender with normal range of motion in all extremities.  Neurologic: Cranial nerves II through XII grossly intact.  Normal intrinsic and opposition testing.  Normal gait without ataxia. Normal speech and language. No gross focal neurologic deficits are  appreciated. Skin:  Skin is warm, dry and intact. No rash noted. Psychiatric: Mood and affect are normal. Patient exhibits appropriate insight and judgment. ____________________________________________  INITIAL IMPRESSION / ASSESSMENT AND PLAN / ED COURSE  Patient with ED evaluation of right shoulder pain following increased activity at work.  Her symptoms appear consistent with a mild overuse syndrome.  She has some mild triceps tendinitis on presentation.  Her exam is overall benign without any acute neuromuscular deficits.  No indication of any internal shoulder derangement.  She will be discharged with prescriptions for naproxen and Flexeril dose as directed.  A work note excusing her for today is provided as well as a suggestion for modified duty for the remainder of the week excluding her from any overhead work activities.  She will follow-up with her primary care provider or the local community clinics for ongoing symptom management. ____________________________________________  FINAL CLINICAL IMPRESSION(S) / ED DIAGNOSES  Final diagnoses:  Overuse syndrome of shoulder, right, initial encounter  Triceps tendinitis      Lissa HoardMenshew, Latice Waitman V Bacon, PA-C 12/11/17 1053    Phineas SemenGoodman, Graydon, MD 12/11/17 1118

## 2017-12-11 NOTE — ED Triage Notes (Addendum)
Patient presents to ED via POV from home with c/o shoulder pain. Patient states yesterday, while at work, she had to pull a lot of screws and her shoulder began hurting her afterwards. Patient does not wish to file workers comp. Patient reports right shoulder pain.

## 2018-01-31 ENCOUNTER — Emergency Department
Admission: EM | Admit: 2018-01-31 | Discharge: 2018-01-31 | Disposition: A | Discharge status: Home / Self Care | Payer: Self-pay | Attending: Emergency Medicine | Admitting: Emergency Medicine

## 2018-01-31 ENCOUNTER — Encounter: Payer: Self-pay | Admitting: Emergency Medicine

## 2018-01-31 ENCOUNTER — Other Ambulatory Visit: Payer: Self-pay

## 2018-01-31 DIAGNOSIS — M545 Low back pain, unspecified: Secondary | ICD-10-CM

## 2018-01-31 DIAGNOSIS — F17200 Nicotine dependence, unspecified, uncomplicated: Secondary | ICD-10-CM | POA: Insufficient documentation

## 2018-01-31 LAB — URINALYSIS, COMPLETE (UACMP) WITH MICROSCOPIC
BACTERIA UA: NONE SEEN
Bilirubin Urine: NEGATIVE
GLUCOSE, UA: NEGATIVE mg/dL
Hgb urine dipstick: NEGATIVE
KETONES UR: NEGATIVE mg/dL
Leukocytes, UA: NEGATIVE
Nitrite: NEGATIVE
PROTEIN: NEGATIVE mg/dL
Specific Gravity, Urine: 1.015 (ref 1.005–1.030)
pH: 7 (ref 5.0–8.0)

## 2018-01-31 LAB — POCT PREGNANCY, URINE: PREG TEST UR: NEGATIVE

## 2018-01-31 NOTE — ED Provider Notes (Signed)
[facility] Emergency Department Provider Note ____________________________________________  Time seen: 1015  I have reviewed the triage vital signs and the nursing notes.  HISTORY  Chief Complaint  Back Pain  HPI Alexis Francis is a 27 y.o. female presents herself to the ED for evaluation of low back pain intermittently over the last week.  Patient denies any injury, accident, or trauma.  She describes symptoms to the bilateral low back pain to the flank.  She denies any distal paresthesias, footdrop, or incontinence.  She does note some urinary frequency but reports low volume.  She is without hematuria, dysuria, or vaginal discharge.  Is been taken ibuprofen without significant benefit.  History reviewed. No pertinent past medical history.  Patient Active Problem List   Diagnosis Date Noted  . PID (acute pelvic inflammatory disease) 05/02/2017    History reviewed. No pertinent surgical history.  Prior to Admission medications   Medication Sig Start Date End Date Taking? Authorizing Provider  cyclobenzaprine (FLEXERIL) 5 MG tablet Take 1 tablet (5 mg total) by mouth 3 (three) times daily as needed for muscle spasms. 12/11/17   Shimika Ames, Charlesetta Ivory, PA-C  ketorolac (TORADOL) 10 MG tablet Take 1 tablet (10 mg total) by mouth every 6 (six) hours as needed for moderate pain. 10/19/17   Tommi Rumps, PA-C    Allergies Patient has no known allergies.  No family history on file.  Social History Social History   Tobacco Use  . Smoking status: Current Every Day Smoker  . Smokeless tobacco: Never Used  Substance Use Topics  . Alcohol use: No  . Drug use: No    Review of Systems  Constitutional: Negative for fever. Cardiovascular: Negative for chest pain. Respiratory: Negative for shortness of breath. Gastrointestinal: Negative for abdominal pain, vomiting and diarrhea. Genitourinary: Negative for dysuria. Musculoskeletal: Positive for back  pain. Skin: Negative for rash. Neurological: Negative for headaches, focal weakness or numbness. ____________________________________________  PHYSICAL EXAM:  VITAL SIGNS: ED Triage Vitals  Enc Vitals Group     BP 01/31/18 0915 119/74     Pulse Rate 01/31/18 0915 92     Resp 01/31/18 0915 20     Temp 01/31/18 0915 98.4 F (36.9 C)     Temp Source 01/31/18 0915 Oral     SpO2 01/31/18 0915 97 %     Weight 01/31/18 0910 250 lb (113.4 kg)     Height 01/31/18 0916 5\' 5"  (1.651 m)     Head Circumference --      Peak Flow --      Pain Score 01/31/18 0910 6     Pain Loc --      Pain Edu? --      Excl. in GC? --     Constitutional: Alert and oriented. Well appearing and in no distress. Head: Normocephalic and atraumatic. Cardiovascular: Normal rate, regular rhythm. Normal distal pulses. Respiratory: Normal respiratory effort. No wheezes/rales/rhonchi. Gastrointestinal: Soft and nontender. No distention.  No CVA tenderness noted. Musculoskeletal: Normal spinal alignment without midline tenderness, spasm, deformity, or step-off.  Nontender with normal range of motion in all extremities.  Neurologic: CN II through XII grossly intact.  Normal gait without ataxia. Normal speech and language. No gross focal neurologic deficits are appreciated. Skin:  Skin is warm, dry and intact. No rash noted. ____________________________________________   LABS (pertinent positives/negatives)  Labs Reviewed  URINALYSIS, COMPLETE (UACMP) WITH MICROSCOPIC - Abnormal; Notable for the following components:      Result Value  Color, Urine YELLOW (*)    APPearance CLEAR (*)    Squamous Epithelial / LPF 0-5 (*)    All other components within normal limits  POCT PREGNANCY, URINE  ____________________________________________  INITIAL IMPRESSION / ASSESSMENT AND PLAN / ED COURSE  She with ED evaluation of a 2-week but of intermittent low back pain bilaterally.  She is without dysuria, incontinence, or  distal paresthesias.  Her exam is overall benign, and her urine is reassuring as it shows no acute infectious process.  Patient will follow up with her primary care provider or the health department for ongoing symptoms. ____________________________________________  FINAL CLINICAL IMPRESSION(S) / ED DIAGNOSES  Final diagnoses:  Acute bilateral low back pain without sciatica      Lissa HoardMenshew, Donn Zanetti V Bacon, PA-C 01/31/18 1904    Arnaldo NatalMalinda, Paul F, MD 02/02/18 [phone]

## 2018-01-31 NOTE — ED Notes (Signed)
See triage note  Presents with lower back pain which started over a week ago denies any injury  Ambulates well  States she has had some urinary problems

## 2018-01-31 NOTE — ED Triage Notes (Signed)
Low back pain over a week.

## 2018-01-31 NOTE — Discharge Instructions (Signed)
Your exam and labs do not show a bladder infection. Take OTC ibuprofen and your muscle relaxant as directed. Follow-up with the health department for ongoing symptoms.

## 2018-03-18 ENCOUNTER — Encounter: Payer: Self-pay | Admitting: Emergency Medicine

## 2018-03-18 ENCOUNTER — Emergency Department
Admission: EM | Admit: 2018-03-18 | Discharge: 2018-03-18 | Disposition: A | Discharge status: Home / Self Care | Payer: Self-pay | Attending: Emergency Medicine | Admitting: Emergency Medicine

## 2018-03-18 ENCOUNTER — Other Ambulatory Visit: Payer: Self-pay

## 2018-03-18 ENCOUNTER — Emergency Department: Payer: Self-pay

## 2018-03-18 DIAGNOSIS — M940 Chondrocostal junction syndrome [Tietze]: Secondary | ICD-10-CM | POA: Insufficient documentation

## 2018-03-18 DIAGNOSIS — Z79899 Other long term (current) drug therapy: Secondary | ICD-10-CM | POA: Insufficient documentation

## 2018-03-18 DIAGNOSIS — F17210 Nicotine dependence, cigarettes, uncomplicated: Secondary | ICD-10-CM | POA: Insufficient documentation

## 2018-03-18 MED ORDER — KETOROLAC TROMETHAMINE 60 MG/2ML IM SOLN
60.0000 mg | Freq: Once | INTRAMUSCULAR | Status: AC
  Administered 2018-03-18: 60 mg via INTRAMUSCULAR
  Filled 2018-03-18: qty 2

## 2018-03-18 MED ORDER — BENZONATATE 100 MG PO CAPS: 200.0000 mg | ORAL_CAPSULE | Freq: Three times a day (TID) | ORAL | 0 refills | Status: DC | PRN

## 2018-03-18 MED ORDER — NAPROXEN 500 MG PO TABS: 500.0000 mg | ORAL_TABLET | Freq: Two times a day (BID) | ORAL | 0 refills | Status: DC

## 2018-03-18 NOTE — ED Provider Notes (Signed)
[facility] Emergency Department Provider Note   ____________________________________________   First MD Initiated Contact with Patient 03/18/18 0815     (approximate)  I have reviewed the triage vital signs and the nursing notes.   HISTORY  Chief Complaint Chest Pain    HPI Alexis Francis is a 27 y.o. female patient complained of right rib pain for 3 days.  Patient denies any injury.  Patient has a nonproductive cough for 2 weeks.  Patient denies she has had shortness of breath with deep inspirations.  Patient describes her pain is "aching".  No palliative measure for complaint.  Rates the pain as a 10/10.   History reviewed. No pertinent past medical history.  Patient Active Problem List   Diagnosis Date Noted  . PID (acute pelvic inflammatory disease) 05/02/2017    History reviewed. No pertinent surgical history.  Prior to Admission medications   Medication Sig Start Date End Date Taking? Authorizing Provider  benzonatate (TESSALON PERLES) 100 MG capsule Take 2 capsules (200 mg total) by mouth 3 (three) times daily as needed. 03/18/18 03/18/19  Joni Reining, PA-C  cyclobenzaprine (FLEXERIL) 5 MG tablet Take 1 tablet (5 mg total) by mouth 3 (three) times daily as needed for muscle spasms. 12/11/17   Menshew, Charlesetta Ivory, PA-C  ketorolac (TORADOL) 10 MG tablet Take 1 tablet (10 mg total) by mouth every 6 (six) hours as needed for moderate pain. 10/19/17   Tommi Rumps, PA-C  naproxen (NAPROSYN) 500 MG tablet Take 1 tablet (500 mg total) by mouth 2 (two) times daily with a meal. 03/18/18   Joni Reining, PA-C    Allergies Patient has no known allergies.  No family history on file.  Social History Social History   Tobacco Use  . Smoking status: Current Every Day Smoker    Packs/day: 0.50    Types: Cigarettes  . Smokeless tobacco: Never Used  Substance Use Topics  . Alcohol use: No  . Drug use: No    Review of  Systems Constitutional: No fever/chills Eyes: No visual changes. ENT: No sore throat. Cardiovascular: Denies chest pain. Respiratory: Denies shortness of breath. Gastrointestinal: No abdominal pain.  No nausea, no vomiting.  No diarrhea.  No constipation. Genitourinary: Negative for dysuria. Musculoskeletal: Right anterior lateral chest wall pain. Skin: Negative for rash. Neurological: Negative for headaches, focal weakness or numbness.   ____________________________________________   PHYSICAL EXAM:  VITAL SIGNS: ED Triage Vitals  Enc Vitals Group     BP 03/18/18 0754 124/67     Pulse Rate 03/18/18 0754 97     Resp 03/18/18 0754 16     Temp 03/18/18 0754 98.2 F (36.8 C)     Temp Source 03/18/18 0754 Oral     SpO2 03/18/18 0754 99 %     Weight 03/18/18 0755 250 lb (113.4 kg)     Height 03/18/18 0755 5\' 5"  (1.651 m)     Head Circumference --      Peak Flow --      Pain Score 03/18/18 0755 6     Pain Loc --      Pain Edu? --      Excl. in GC? --    Constitutional: Alert and oriented. Well appearing and in no acute distress. Nose: Clear rhinorrhea Mouth/Throat: Mucous membranes are moist.  Oropharynx non-erythematous.  Postnasal drainage Neck: No stridor.  Hematological/Lymphatic/Immunilogical: No cervical lymphadenopathy. Cardiovascular: Normal rate, regular rhythm. Grossly normal heart sounds.  Good peripheral  circulation. Respiratory: Decreased respiratory effort.  No retractions. Lungs CTAB. Gastrointestinal: Soft and nontender. No distention. No abdominal bruits. No CVA tenderness. Neurologic:  Normal speech and language. No gross focal neurologic deficits are appreciated. No gait instability. Skin:  Skin is warm, dry and intact. No rash noted. Psychiatric: Mood and affect are normal. Speech and behavior are normal.  ____________________________________________   LABS (all labs ordered are listed, but only abnormal results are displayed)  Labs Reviewed - No data  to display ____________________________________________  EKG  No acute findings on EKG is read by heart station doctor. ____________________________________________  RADIOLOGY  No acute findings on chest x-ray.  Official radiology report(s): Dg Chest 2 View  Result Date: 03/18/2018 CLINICAL DATA:  3 week history of cough. History of fever. Pain right rib area. EXAM: CHEST - 2 VIEW COMPARISON:  CT 12/08/2008.  Chest x-ray 12/08/2008. FINDINGS: Mediastinum and hilar structures normal. Low lung volumes. Questionable tiny nodule left lung base. Repeat PA and lateral chest x-ray with deeper inspiration suggested for further evaluation. No pleural effusion or pneumothorax. No acute bony abnormality. IMPRESSION: Low lung volumes. Questionable tiny pulmonary nodule left lung base. Repeat PA lateral chest x-ray with deeper inspiration suggested for further evaluation. No acute cardiopulmonary disease identified. Electronically Signed   By: Maisie Fushomas  Register   On: 03/18/2018 08:55    ____________________________________________   PROCEDURES  Procedure(s) performed: None  Procedures  Critical Care performed: No  ____________________________________________   INITIAL IMPRESSION / ASSESSMENT AND PLAN / ED COURSE  As part of my medical decision making, I reviewed the following data within the electronic MEDICAL RECORD NUMBER    Patient presents with right anterior lateral chest wall pain secondary to prolonged coughing.  Chest x-ray was unremarkable.  Patient physical exam findings consistent with costochondritis.  Patient given discharge care instruction [facility]  Patient given a prescription for Tessalon Perles and naproxen.      ____________________________________________   FINAL CLINICAL IMPRESSION(S) / ED DIAGNOSES  Final diagnoses:  Costochondritis     ED Discharge Orders        Ordered    naproxen (NAPROSYN) 500 MG  tablet  2 times daily with meals     03/18/18 0918    benzonatate (TESSALON PERLES) 100 MG capsule  3 times daily PRN     03/18/18 [id number]       Note:  This document was prepared using Dragon voice recognition software and may include unintentional dictation errors.    Joni ReiningSmith, Erikah Thumm K, PA-C 03/18/18 [id number]    Emily FilbertWilliams, Jonathan E, MD 03/18/18 [phone]

## 2018-03-18 NOTE — ED Notes (Signed)
See triage note  Presents with a 3 week hx of cough which has been intermittently prod  Had fever about 2 weeks ago    Afebrile on arrival  But states she developed pain to right rib area on Friday  having increased pain with breathing

## 2018-03-18 NOTE — ED Triage Notes (Signed)
Pt in via POV with complaints of right rib pain x 3 days, denies any recent injury.  Vitals WDL, NAD noted at this time.

## 2018-04-14 ENCOUNTER — Encounter: Payer: Self-pay | Admitting: Emergency Medicine

## 2018-04-14 ENCOUNTER — Emergency Department: Payer: Self-pay

## 2018-04-14 ENCOUNTER — Emergency Department
Admission: EM | Admit: 2018-04-14 | Discharge: 2018-04-14 | Disposition: A | Discharge status: Home / Self Care | Payer: Self-pay | Attending: Emergency Medicine | Admitting: Emergency Medicine

## 2018-04-14 DIAGNOSIS — F17210 Nicotine dependence, cigarettes, uncomplicated: Secondary | ICD-10-CM | POA: Insufficient documentation

## 2018-04-14 DIAGNOSIS — R0789 Other chest pain: Secondary | ICD-10-CM | POA: Insufficient documentation

## 2018-04-14 LAB — CBC
HCT: 37.5 % (ref 35.0–47.0)
Hemoglobin: 12.8 g/dL (ref 12.0–16.0)
MCH: 32.6 pg (ref 26.0–34.0)
MCHC: 34.1 g/dL (ref 32.0–36.0)
MCV: 95.6 fL (ref 80.0–100.0)
PLATELETS: 313 10*3/uL (ref 150–440)
RBC: 3.93 MIL/uL (ref 3.80–5.20)
RDW: 13.5 % (ref 11.5–14.5)
WBC: 4.6 10*3/uL (ref 3.6–11.0)

## 2018-04-14 LAB — BASIC METABOLIC PANEL
Anion gap: 5 (ref 5–15)
BUN: 14 mg/dL (ref 6–20)
CHLORIDE: 111 mmol/L (ref 101–111)
CO2: 24 mmol/L (ref 22–32)
CREATININE: 0.79 mg/dL (ref 0.44–1.00)
Calcium: 8.7 mg/dL — ABNORMAL LOW (ref 8.9–10.3)
Glucose, Bld: 114 mg/dL — ABNORMAL HIGH (ref 65–99)
Potassium: 4 mmol/L (ref 3.5–5.1)
SODIUM: 140 mmol/L (ref 135–145)

## 2018-04-14 LAB — TROPONIN I

## 2018-04-14 LAB — POCT PREGNANCY, URINE: Preg Test, Ur: NEGATIVE

## 2018-04-14 MED ORDER — IOHEXOL 350 MG/ML SOLN
75.0000 mL | Freq: Once | INTRAVENOUS | Status: AC | PRN
  Administered 2018-04-14: 75 mL via INTRAVENOUS

## 2018-04-14 MED ORDER — METHOCARBAMOL 500 MG PO TABS: 500.0000 mg | ORAL_TABLET | Freq: Three times a day (TID) | ORAL | 0 refills | Status: DC

## 2018-04-14 NOTE — ED Notes (Signed)
Urine sent to lab 

## 2018-04-14 NOTE — ED Provider Notes (Signed)
[facility] Emergency Department Provider Note   ____________________________________________    I have reviewed the triage vital signs and the nursing notes.   HISTORY  Chief Complaint Chest Pain     HPI Alexis Francis is a 27 y.o. female who presents with complaints of chest pain.  Patient reports she has had right-sided chest discomfort primarily over the last 3-4 weeks.  She describes it as an aching moderate pain she does report a get somewhat worse with deep inspiration.  She did have a cough originally but that is improved.  Denies fevers or chills.  No recent travel.  No calf pain or swelling.  Has not take anything for this.  Was seen in the emergency department 1 month ago but did not take medications prescribed.  History reviewed. No pertinent past medical history.  Patient Active Problem List   Diagnosis Date Noted  . PID (acute pelvic inflammatory disease) 05/02/2017    History reviewed. No pertinent surgical history.  Prior to Admission medications   Medication Sig Start Date End Date Taking? Authorizing Provider  benzonatate (TESSALON PERLES) 100 MG capsule Take 2 capsules (200 mg total) by mouth 3 (three) times daily as needed. Patient not taking: Reported on 04/14/2018 03/18/18 03/18/19  Joni ReiningSmith, Ronald K, PA-C  cyclobenzaprine (FLEXERIL) 5 MG tablet Take 1 tablet (5 mg total) by mouth 3 (three) times daily as needed for muscle spasms. Patient not taking: Reported on 04/14/2018 12/11/17   Menshew, Charlesetta IvoryJenise V Bacon, PA-C  ketorolac (TORADOL) 10 MG tablet Take 1 tablet (10 mg total) by mouth every 6 (six) hours as needed for moderate pain. Patient not taking: Reported on 04/14/2018 10/19/17   Tommi RumpsSummers, Rhonda L, PA-C  methocarbamol (ROBAXIN) 500 MG tablet Take 1 tablet (500 mg total) by mouth 3 (three) times daily. 04/14/18   Jene EveryKinner, Leeroy Lovings, MD  naproxen (NAPROSYN) 500 MG tablet Take 1 tablet (500 mg total) by mouth 2 (two) times daily with a  meal. Patient not taking: Reported on 04/14/2018 03/18/18   Joni ReiningSmith, Ronald K, PA-C     Allergies Patient has no known allergies.  No family history on file.  Social History Social History   Tobacco Use  . Smoking status: Current Every Day Smoker    Packs/day: 0.50    Types: Cigarettes  . Smokeless tobacco: Never Used  Substance Use Topics  . Alcohol use: No  . Drug use: No    Review of Systems  Constitutional: No fever/chills Eyes: No visual changes.  ENT: No sore throat. Cardiovascular: As above Respiratory: No significant shortness of breath Gastrointestinal: No abdominal pain.  No nausea, no vomiting.   Genitourinary: Negative for dysuria. Musculoskeletal: Negative for back pain. Skin: Negative for rash. Neurological: Negative for headaches or weakness   ____________________________________________   PHYSICAL EXAM:  VITAL SIGNS: ED Triage Vitals [04/14/18 0706]  Enc Vitals Group     BP 123/70     Pulse Rate 87     Resp 16     Temp 98.2 F (36.8 C)     Temp Source Oral     SpO2 100 %     Weight 113.4 kg (250 lb)     Height 1.651 m (5\' 5" )     Head Circumference      Peak Flow      Pain Score 9     Pain Loc      Pain Edu?      Excl. in GC?  Constitutional: Alert and oriented. No acute distress. Pleasant and interactive Eyes: Conjunctivae are normal.   Nose: No congestion/rhinnorhea. Mouth/Throat: Mucous membranes are moist.   Neck:  Painless ROM Cardiovascular: Normal rate, regular rhythm. Grossly normal heart sounds.  Good peripheral circulation.  Mild tenderness palpation of the right-sided chest wall beneath the breast Respiratory: Normal respiratory effort.  No retractions. Lungs CTAB. Gastrointestinal: Soft and nontender. No distention.  No CVA tenderness. Genitourinary: deferred Musculoskeletal: No lower extremity tenderness nor edema.  Warm and well perfused Neurologic:  Normal speech and language. No gross focal neurologic deficits are  appreciated.  Skin:  Skin is warm, dry and intact. No rash noted. Psychiatric: Mood and affect are normal. Speech and behavior are normal.  ____________________________________________   LABS (all labs ordered are listed, but only abnormal results are displayed)  Labs Reviewed  BASIC METABOLIC PANEL - Abnormal; Notable for the following components:      Result Value   Glucose, Bld 114 (*)    Calcium 8.7 (*)    All other components within normal limits  CBC  TROPONIN I  POC URINE PREG, ED  POCT PREGNANCY, URINE   ____________________________________________  EKG  ED ECG REPORT I, Jene Every, the attending physician, personally viewed and interpreted this ECG.  Date: 04/14/2018  Rhythm: normal sinus rhythm QRS Axis: normal Intervals: normal ST/T Wave abnormalities: normal Narrative Interpretation: no evidence of acute ischemia  ____________________________________________  RADIOLOGY  Chest x-ray unremarkable CT angiography negative for PE ____________________________________________   PROCEDURES  Procedure(s) performed: No  Procedures   Critical Care performed: No ____________________________________________   INITIAL IMPRESSION / ASSESSMENT AND PLAN / ED COURSE  Pertinent labs & imaging results that were available during my care of the patient were reviewed by me and considered in my medical decision making (see chart for details).  Patient well-appearing in no acute distress, she has pleuritic chest discomfort over the last month.  Well-appearing EKG normal.  Labs unremarkable.  Chest x-ray reassuring however questionable pneumonia on chest x-ray.  Given pleurisy over this time and questionable x-ray will obtain CT angiography.  CT angiography reassuring, will have the patient follow-up with cardiology for further evaluation of likely chest wall pain.    ____________________________________________   FINAL CLINICAL IMPRESSION(S) / ED  DIAGNOSES  Final diagnoses:  Atypical chest pain        Note:  This document was prepared using Dragon voice recognition software and may include unintentional dictation errors.    Jene Every, MD 04/14/18 [phone]

## 2018-04-14 NOTE — ED Triage Notes (Signed)
Pt reports pain under right breast x1 month, was seen here a month ago and told her lung was swollen but didn't get medications to help. Pt reports pain while wearing bra, pain when moving.

## 2018-07-19 ENCOUNTER — Encounter: Payer: Self-pay | Admitting: Emergency Medicine

## 2018-07-19 ENCOUNTER — Other Ambulatory Visit: Payer: Self-pay

## 2018-07-19 DIAGNOSIS — R42 Dizziness and giddiness: Secondary | ICD-10-CM | POA: Insufficient documentation

## 2018-07-19 DIAGNOSIS — F17210 Nicotine dependence, cigarettes, uncomplicated: Secondary | ICD-10-CM | POA: Insufficient documentation

## 2018-07-19 DIAGNOSIS — R110 Nausea: Secondary | ICD-10-CM | POA: Insufficient documentation

## 2018-07-19 DIAGNOSIS — R51 Headache: Secondary | ICD-10-CM | POA: Insufficient documentation

## 2018-07-19 DIAGNOSIS — N83201 Unspecified ovarian cyst, right side: Secondary | ICD-10-CM | POA: Insufficient documentation

## 2018-07-19 DIAGNOSIS — R197 Diarrhea, unspecified: Secondary | ICD-10-CM | POA: Insufficient documentation

## 2018-07-19 LAB — URINALYSIS, COMPLETE (UACMP) WITH MICROSCOPIC
BACTERIA UA: NONE SEEN
BILIRUBIN URINE: NEGATIVE
Glucose, UA: NEGATIVE mg/dL
Hgb urine dipstick: NEGATIVE
Ketones, ur: 20 mg/dL — AB
LEUKOCYTES UA: NEGATIVE
Nitrite: NEGATIVE
Protein, ur: NEGATIVE mg/dL
SPECIFIC GRAVITY, URINE: 1.017 (ref 1.005–1.030)
pH: 5 (ref 5.0–8.0)

## 2018-07-19 LAB — CBC
HEMATOCRIT: 39.8 % (ref 35.0–47.0)
Hemoglobin: 13.6 g/dL (ref 12.0–16.0)
MCH: 32 pg (ref 26.0–34.0)
MCHC: 34.2 g/dL (ref 32.0–36.0)
MCV: 93.4 fL (ref 80.0–100.0)
Platelets: 265 10*3/uL (ref 150–440)
RBC: 4.26 MIL/uL (ref 3.80–5.20)
RDW: 12.8 % (ref 11.5–14.5)
WBC: 4.3 10*3/uL (ref 3.6–11.0)

## 2018-07-19 LAB — BASIC METABOLIC PANEL
Anion gap: 6 (ref 5–15)
BUN: 7 mg/dL (ref 6–20)
CHLORIDE: 107 mmol/L (ref 98–111)
CO2: 23 mmol/L (ref 22–32)
CREATININE: 0.92 mg/dL (ref 0.44–1.00)
Calcium: 9 mg/dL (ref 8.9–10.3)
GFR calc Af Amer: >60 mL/min (ref >60)
GFR calc non Af Amer: >60 mL/min (ref >60)
GLUCOSE: 98 mg/dL (ref 70–99)
POTASSIUM: 3.5 mmol/L (ref 3.5–5.1)
Sodium: 136 mmol/L (ref 135–145)

## 2018-07-19 LAB — POCT PREGNANCY, URINE: PREG TEST UR: NEGATIVE

## 2018-07-19 NOTE — ED Triage Notes (Signed)
Pt arrives ambulatory to triage with c/o dizziness x 1 - 1/2 weeks. Pt is ambulatory to triage and in NAD.

## 2018-07-20 ENCOUNTER — Emergency Department: Payer: Self-pay

## 2018-07-20 ENCOUNTER — Emergency Department
Admission: EM | Admit: 2018-07-20 | Discharge: 2018-07-20 | Disposition: A | Discharge status: Home / Self Care | Payer: Self-pay | Attending: Emergency Medicine | Admitting: Emergency Medicine

## 2018-07-20 ENCOUNTER — Encounter: Payer: Self-pay | Admitting: Radiology

## 2018-07-20 DIAGNOSIS — R42 Dizziness and giddiness: Secondary | ICD-10-CM

## 2018-07-20 DIAGNOSIS — R51 Headache: Secondary | ICD-10-CM

## 2018-07-20 DIAGNOSIS — N83201 Unspecified ovarian cyst, right side: Secondary | ICD-10-CM

## 2018-07-20 DIAGNOSIS — R519 Headache, unspecified: Secondary | ICD-10-CM

## 2018-07-20 MED ORDER — BUTALBITAL-APAP-CAFFEINE 50-325-40 MG PO TABS: 1.0000 | ORAL_TABLET | Freq: Four times a day (QID) | ORAL | 0 refills | Status: AC | PRN

## 2018-07-20 MED ORDER — IOPAMIDOL (ISOVUE-300) INJECTION 61%
125.0000 mL | Freq: Once | INTRAVENOUS | Status: AC | PRN
  Administered 2018-07-20: 125 mL via INTRAVENOUS

## 2018-07-20 MED ORDER — SODIUM CHLORIDE 0.9 % IV BOLUS
1000.0000 mL | Freq: Once | INTRAVENOUS | Status: AC
  Administered 2018-07-20: 1000 mL via INTRAVENOUS

## 2018-07-20 NOTE — ED Notes (Addendum)
Call bell answered; pt given another warm blanket for comfort; thermostat adjusted for more warmth; IV site unremarkable, fluids infusing without difficulty; pt to CT via stretcher

## 2018-07-20 NOTE — ED Notes (Addendum)
Dr brown at bedside; pt reports "sometimes", last time Saturday am; frontal headache; no sinus congestion; no neuro deficits; lungs clear

## 2018-07-20 NOTE — ED Notes (Signed)
Dr Brown in to follow up with pt 

## 2018-07-20 NOTE — ED Notes (Signed)
Answered pt's call bell; wanted to let nurse know IV fluids were completed; let pt know I'd been in about 10 minutes ago to stop them and she was asleep; denies pain

## 2018-07-20 NOTE — ED Provider Notes (Signed)
[facility] Emergency Department Provider Note    First MD Initiated Contact with Patient 07/20/18 0134     (approximate)  I have reviewed the triage vital signs and the nursing notes.   HISTORY  Chief Complaint Dizziness    HPI Alexis Francis is a 27 y.o. female to the emergency department with multiple complaints.  Patient states that she has had intermittent dizziness over the last 2 weeks.  Patient admits to poor p.o. intake diarrhea for the last few days which is since resolved as well as abdominal discomfort which is currently 5 out of 10.  Patient mid to nausea however no vomiting.  Patient denies any fever.  Patient denies any vaginal symptoms no discharge.  Patient denies any urinary symptoms.  Patient states dizziness is worse with positional change.  Patient denies any weakness numbness gait instability or visual changes.   History reviewed. No pertinent past medical history.  Patient Active Problem List   Diagnosis Date Noted  . PID (acute pelvic inflammatory disease) 05/02/2017      Prior to Admission medications   Medication Sig Start Date End Date Taking? Authorizing Provider  butalbital-acetaminophen-caffeine (FIORICET, ESGIC) 815554757950-325-40 MG tablet Take 1 tablet by mouth every 6 (six) hours as needed for headache. 07/20/18 07/20/19  Darci CurrentBrown, Vinton N, MD    Allergies Patient has no known allergies.  No family history on file.  Social History Social History   Tobacco Use  . Smoking status: Current Every Day Smoker    Packs/day: 0.50    Types: Cigarettes  . Smokeless tobacco: Never Used  Substance Use Topics  . Alcohol use: No  . Drug use: No    Review of Systems Constitutional: No fever/chills Eyes: No visual changes. ENT: No sore throat. Cardiovascular: Denies chest pain. Respiratory: Denies shortness of breath. Gastrointestinal: Positive for abdominal pain and nausea, no vomiting.  Positive for diarrhea.  No  constipation. Genitourinary: Negative for dysuria. Musculoskeletal: Negative for neck pain.  Negative for back pain. Integumentary: Negative for rash. Neurological: Negative for headaches, focal weakness or numbness.  Positive for dizziness   ____________________________________________   PHYSICAL EXAM:  VITAL SIGNS: ED Triage Vitals  Enc Vitals Group     BP 07/19/18 2151 119/88     Pulse Rate 07/19/18 2151 (!) 105     Resp 07/19/18 2151 16     Temp 07/19/18 2151 99.3 F (37.4 C)     Temp Source 07/19/18 2151 Oral     SpO2 07/19/18 2151 99 %     Weight 07/19/18 2151 113.4 kg (250 lb)     Height 07/19/18 2151 1.651 m (5\' 5" )     Head Circumference --      Peak Flow --      Pain Score 07/19/18 2158 6     Pain Loc --      Pain Edu? --      Excl. in GC? --     Constitutional: Alert and oriented. Well appearing and in no acute distress. Eyes: Conjunctivae are normal. PERRL. EOMI. Head: Atraumatic. Mouth/Throat: Mucous membranes are moist.  Oropharynx non-erythematous. Neck: No stridor.   Cardiovascular: Normal rate, regular rhythm. Good peripheral circulation. Grossly normal heart sounds. Respiratory: Normal respiratory effort.  No retractions. Lungs CTAB. Gastrointestinal: Diffuse tenderness palpation worse right upper right lower quadrant.. No distention.  Musculoskeletal: No lower extremity tenderness nor edema. No gross deformities of extremities. Neurologic:  Normal speech and language. No gross focal neurologic deficits are appreciated.  Skin:  Skin is warm, dry and intact. No rash noted. Psychiatric: Mood and affect are normal. Speech and behavior are normal.  ____________________________________________   LABS (all labs ordered are listed, but only abnormal results are displayed)  Labs Reviewed  URINALYSIS, COMPLETE (UACMP) WITH MICROSCOPIC - Abnormal; Notable for the following components:      Result Value   Color, Urine YELLOW (*)    APPearance CLOUDY (*)      Ketones, ur 20 (*)    All other components within normal limits  BASIC METABOLIC PANEL  CBC  POCT PREGNANCY, URINE  CBG MONITORING, ED  POC URINE PREG, ED   ____________________________________________  EKG  ED ECG REPORT I, Hollister N Rayanna Matusik, the attending physician, personally viewed and interpreted this ECG.   Date: 07/19/2018  EKG Time: 9:51 PM  Rate: 105  Rhythm: Sinus tachycardia  Axis: Normal  Intervals: Normal  ST&T Change: None  ____________________________________________  RADIOLOGY I, Unicoi N Mychelle Kendra, personally viewed and evaluated these images (plain radiographs) as part of my medical decision making, as well as reviewing the written report by the radiologist.  ED MD interpretation: 3.8 cm right ovarian cysts  Official radiology report(s): Ct Abdomen Pelvis W Contrast  Result Date: 07/20/2018 CLINICAL DATA:  Low back and midline abdominal pain EXAM: CT ABDOMEN AND PELVIS WITH CONTRAST TECHNIQUE: Multidetector CT imaging of the abdomen and pelvis was performed using the standard protocol following bolus administration of intravenous contrast. CONTRAST:  ISOVUE-300 IOPAMIDOL (ISOVUE-300) INJECTION 61% COMPARISON:  CT 04/20/2016 FINDINGS: Lower chest: No acute abnormality. Hepatobiliary: No focal liver abnormality is seen. No gallstones, gallbladder wall thickening, or biliary dilatation. Pancreas: Unremarkable. No pancreatic ductal dilatation or surrounding inflammatory changes. Spleen: Normal in size without focal abnormality. Adrenals/Urinary Tract: Adrenal glands are unremarkable. Kidneys are normal, without renal calculi, focal lesion, or hydronephrosis. Bladder is unremarkable. Stomach/Bowel: Stomach is within normal limits. Appendix appears normal. No evidence of bowel wall thickening, distention, or inflammatory changes. Vascular/Lymphatic: No significant vascular findings are present. No enlarged abdominal or pelvic lymph nodes. Reproductive: Uterus is  unremarkable. Multiple right ovarian cysts, measuring up to 3.8 cm. Other: No free air or free fluid. Musculoskeletal: Subacute right eighth posterolateral rib fracture. IMPRESSION: 1. No CT evidence for acute intra-abdominal or pelvic abnormality. 2. Multiple right ovarian cysts measuring up to 3.8 cm 3. Subacute appearing right eighth posterolateral rib fracture Electronically Signed   By: Jasmine Pang M.D.   On: 07/20/2018 02:49    ____________________________________________   Procedures   ____________________________________________   INITIAL IMPRESSION / ASSESSMENT AND PLAN / ED COURSE  As part of my medical decision making, I reviewed the following data within the electronic MEDICAL RECORD NUMBER  27 year old female presented with above-stated history and physical exam secondary to multiple medical complaints.  Regarding the patient's abdominal discomfort CT scan of the abdomen was performed to evaluate for intra-abdominal pathology multiple right ovarian cyst was noted with measuring up to 3.8 cm.  Suspect patient's dizziness to be secondary to dehydration given 20+ ketones in the urine and known history of diarrhea as such patient received IV normal saline 2 liters with solution of dizziness.  Patient informed of finding of an ovarian cyst and given warning signs that would warrant immediate evaluation.  Patient will be referred to OB/GYN.  Patient advised to continue oral hydration at home.  _______________________________  FINAL CLINICAL IMPRESSION(S) / ED DIAGNOSES  Final diagnoses:  Dizziness  Cyst of right ovary  Acute nonintractable headache, unspecified headache  type  Dehydration   MEDICATIONS GIVEN DURING THIS VISIT:  Medications  sodium chloride 0.9 % bolus 1,000 mL (0 mLs Intravenous Stopped 07/20/18 0410)  sodium chloride 0.9 % bolus 1,000 mL (0 mLs Intravenous Stopped 07/20/18 0308)  iopamidol (ISOVUE-300) 61 % injection 125 mL (125 mLs Intravenous Contrast Given  07/20/18 0227)     ED Discharge Orders        Ordered    butalbital-acetaminophen-caffeine (FIORICET, ESGIC) 50-325-40 MG tablet  Every 6 hours PRN     07/20/18 0441       Note:  This document was prepared using Dragon voice recognition software and may include unintentional dictation errors.    Darci Current, MD 07/21/18 2212

## 2018-07-20 NOTE — ED Notes (Signed)
Pt back from CT

## 2018-07-20 NOTE — ED Notes (Signed)
Pt reports feeling dizzy for over 1 week; says she hasn't eaten or had anything to eat today so she's nauseous; this is what brought her to the ED; pt c/o lower back pain and lower midline abd pain; denies urinary s/s; pt talking in complete coherent sentences; in no acute distress;

## 2018-07-20 NOTE — ED Notes (Signed)
Eyes closed, resp even and unlabored 

## 2018-07-25 ENCOUNTER — Encounter: Payer: Self-pay | Admitting: Obstetrics and Gynecology

## 2018-07-25 ENCOUNTER — Ambulatory Visit (INDEPENDENT_AMBULATORY_CARE_PROVIDER_SITE_OTHER): Payer: Self-pay | Admitting: Obstetrics and Gynecology

## 2018-07-25 VITALS — BP 112/70 | HR 86 | Ht 65.0 in | Wt 242.0 lb

## 2018-07-25 DIAGNOSIS — N838 Other noninflammatory disorders of ovary, fallopian tube and broad ligament: Secondary | ICD-10-CM

## 2018-07-25 DIAGNOSIS — N7011 Chronic salpingitis: Secondary | ICD-10-CM

## 2018-07-25 DIAGNOSIS — R102 Pelvic and perineal pain: Secondary | ICD-10-CM

## 2018-07-25 DIAGNOSIS — N839 Noninflammatory disorder of ovary, fallopian tube and broad ligament, unspecified: Secondary | ICD-10-CM

## 2018-07-25 NOTE — Progress Notes (Signed)
Obstetrics & Gynecology Office Visit   Chief Complaint:  Chief Complaint  Patient presents with  . Ovarian Cyst    ER follow up right ovarian cyst    History of Present Illness: The patient is a 27 y.o. female presenting for emergency room follow up concerning a recently imaged right adnexal cysts.  Initial presentation was prompted by right lower quadrant.  Previous CT imaging demonstrated dimensions of 3.6cm.  Appearance was notable complex appearance, no free fluid, no lymphadenopathy and absence of ascites. The patient endorses associated symptoms of pelvic pain, nausea and emesis.  The patient denies associated symptoms of  weight gain, weight loss, night sweats, vaginal bleeding, pelvic pressure, constipation and diarrhea.  There is not a notable family history of ovarian cancer, uterine cancer, breast cancer, or colon cancer.  GC/CT cultures 07/08/2018 negative and negative. Rare WBC on wet mount. WBC count of of 4.3.   Finding appear chronic with first documentation 05/02/2017 at which time a complex 3.5cm left ovarian cyst and bilateral hydrosalpinx   Pain is ipsilateral to the imaging findings.  Review of Systems: 10 point review of systems negative unless otherwise noted in HPI  Past Medical History:  Past Medical History:  Diagnosis Date  . Ovarian cyst     Past Surgical History:  History reviewed. No pertinent surgical history.  Gynecologic History: Patient's last menstrual period was 06/30/2018 (approximate).  Obstetric History: No obstetric history on file.  Family History:  History reviewed. No pertinent family history.  Social History:  Social History   Socioeconomic History  . Marital status: Single    Spouse name: Not on file  . Number of children: Not on file  . Years of education: Not on file  . Highest education level: Not on file  Occupational History  . Not on file  Social Needs  . Financial resource strain: Not on file  . Food insecurity:   Worry: Not on file    Inability: Not on file  . Transportation needs:    Medical: Not on file    Non-medical: Not on file  Tobacco Use  . Smoking status: Current Every Day Smoker    Packs/day: 0.50    Types: Cigarettes  . Smokeless tobacco: Never Used  Substance and Sexual Activity  . Alcohol use: No  . Drug use: Yes    Types: Marijuana  . Sexual activity: Yes    Birth control/protection: None  Lifestyle  . Physical activity:    Days per week: Not on file    Minutes per session: Not on file  . Stress: Not on file  Relationships  . Social connections:    Talks on phone: Not on file    Gets together: Not on file    Attends religious service: Not on file    Active member of club or organization: Not on file    Attends meetings of clubs or organizations: Not on file    Relationship status: Not on file  . Intimate partner violence:    Fear of current or ex partner: Not on file    Emotionally abused: Not on file    Physically abused: Not on file    Forced sexual activity: Not on file  Other Topics Concern  . Not on file  Social History Narrative  . Not on file    Allergies:  No Known Allergies  Medications: Prior to Admission medications   Medication Sig Start Date End Date Taking? Authorizing Provider  butalbital-acetaminophen-caffeine (FIORICET, ESGIC)  50-325-40 MG tablet Take 1 tablet by mouth every 6 (six) hours as needed for headache. 07/20/18 07/20/19  Darci Current, MD    Physical Exam Vitals:  Vitals:   07/25/18 0923  BP: 112/70  Pulse: 86   Patient's last menstrual period was 06/30/2018 (approximate).  General: NAD HEENT: normocephalic, anicteric Thyroid: no enlargement, no palpable nodules Pulmonary: No increased work of breathing Abdomen: Soft, non-tender, non-distended.  Umbilicus without lesions.  No hepatomegaly, splenomegaly or masses palpable. No evidence of hernia  Extremities: no edema, erythema, or tenderness Neurologic: Grossly  intact Psychiatric: mood appropriate, affect full   Ct Abdomen Pelvis W Contrast  Result Date: 07/20/2018 CLINICAL DATA:  Low back and midline abdominal pain EXAM: CT ABDOMEN AND PELVIS WITH CONTRAST TECHNIQUE: Multidetector CT imaging of the abdomen and pelvis was performed using the standard protocol following bolus administration of intravenous contrast. CONTRAST:  ISOVUE-300 IOPAMIDOL (ISOVUE-300) INJECTION 61% COMPARISON:  CT 04/20/2016 FINDINGS: Lower chest: No acute abnormality. Hepatobiliary: No focal liver abnormality is seen. No gallstones, gallbladder wall thickening, or biliary dilatation. Pancreas: Unremarkable. No pancreatic ductal dilatation or surrounding inflammatory changes. Spleen: Normal in size without focal abnormality. Adrenals/Urinary Tract: Adrenal glands are unremarkable. Kidneys are normal, without renal calculi, focal lesion, or hydronephrosis. Bladder is unremarkable. Stomach/Bowel: Stomach is within normal limits. Appendix appears normal. No evidence of bowel wall thickening, distention, or inflammatory changes. Vascular/Lymphatic: No significant vascular findings are present. No enlarged abdominal or pelvic lymph nodes. Reproductive: Uterus is unremarkable. Multiple right ovarian cysts, measuring up to 3.8 cm. Other: No free air or free fluid. Musculoskeletal: Subacute right eighth posterolateral rib fracture. IMPRESSION: 1. No CT evidence for acute intra-abdominal or pelvic abnormality. 2. Multiple right ovarian cysts measuring up to 3.8 cm 3. Subacute appearing right eighth posterolateral rib fracture Electronically Signed   By: Jasmine Pang M.D.   On: 07/20/2018 02:49    Assessment: 27 y.o. No obstetric history on file. presenting for emergency room follow up of right ovarian cyst  Plan: Problem List Items Addressed This Visit    None    Visit Diagnoses    Ovarian mass, right    -  Primary   Relevant Orders   US Transvaginal Non-OB   Hydrosalpinx        Relevant Orders   US Transvaginal Non-OB   Pelvic pain       Relevant Orders   US Transvaginal Non-OB       1) The incidence and implication of adnexal masses and ovarian cysts were discussed with the patient in detail.  Prior imaging if available was reviewed at today's visit..  The vast majority of these lesions will represent benign or physiologic processes and may well resolve on repeat imaging with expectant management.  We discussed that in a premenopausal patient not on ovulation suppression with via a systemic form hormonal contraception the normal function of the ovary during follicular development is the formation of a dominant follicle or cyst(s) every month.  This is an essential part of normal reproductive physiology.  In some cases these cysts can take on larger dimensions, hemorrhage, or undergo torsion making them symptomatic. Torsion is relatively unlikely for lesions under 5 cm.  Based on initial imaging findings the overall concern for malignancy is deemed low.  We will obtain follow up imagine approximately 6 weeks from the date of the initial imaging study.  Torsion precautions were given.    1) POSTMENOPAUSAL The incidence and implication of adnexal masses and ovarian  cysts were discussed with the patient in detail.  Prior imaging if available was reviewed at today's visit.  While adnexal masses and cysts are a less common imaging finding in postmenopausal women as compared to premenopausal women, the vast majority of these lesions are still benign.  Follow up imaging to determine stability in size and appearance is reasonable in order to provide additional reassurance.  In some cases symptoms, family history, or indeterminate or concerning findings may warrant surgical evaluation and referral to a Gynecology-Oncologist., or serum tumor markers.   - Based on appearance of CT scan on personal review, as well as review of imaging in May 2018 suspect the findings are represent  hydrosalpinx and chronic sequela of PID/TOA  2) Tumor makers were not ordered  3)  Return in about 1 month (around 08/22/2018) for gyn ultrasound and annual exam.   Vena AustriaAndreas Lehi Phifer, MD, Merlinda FrederickFACOG [facility], [facility] 07/25/2018, 12:06 PM

## 2019-01-06 ENCOUNTER — Emergency Department
Admission: EM | Admit: 2019-01-06 | Discharge: 2019-01-06 | Disposition: A | Discharge status: Home / Self Care | Payer: Self-pay | Attending: Emergency Medicine | Admitting: Emergency Medicine

## 2019-01-06 ENCOUNTER — Other Ambulatory Visit: Payer: Self-pay

## 2019-01-06 ENCOUNTER — Encounter: Payer: Self-pay | Admitting: Emergency Medicine

## 2019-01-06 DIAGNOSIS — M62830 Muscle spasm of back: Secondary | ICD-10-CM | POA: Insufficient documentation

## 2019-01-06 DIAGNOSIS — F17210 Nicotine dependence, cigarettes, uncomplicated: Secondary | ICD-10-CM | POA: Insufficient documentation

## 2019-01-06 MED ORDER — LIDOCAINE 5 % EX PTCH
1.0000 | MEDICATED_PATCH | CUTANEOUS | Status: DC
  Administered 2019-01-06: 1 via TRANSDERMAL
  Filled 2019-01-06: qty 1

## 2019-01-06 MED ORDER — LIDOCAINE 5 % EX PTCH: 1.0000 | MEDICATED_PATCH | Freq: Two times a day (BID) | CUTANEOUS | 0 refills | Status: DC

## 2019-01-06 MED ORDER — CYCLOBENZAPRINE HCL 10 MG PO TABS: 10.0000 mg | ORAL_TABLET | Freq: Three times a day (TID) | ORAL | 0 refills | Status: DC | PRN

## 2019-01-06 NOTE — ED Provider Notes (Signed)
[facility] Emergency Department Provider Note   ____________________________________________   First MD Initiated Contact with Patient 01/06/19 [phone]     (approximate)  I have reviewed the triage vital signs and the nursing notes.   HISTORY  Chief Complaint Back Pain    HPI Alexis Francis is a 28 y.o. female patient presents with back pain for 1 week.  Patient states no injury.  Patient relates repetitive twisting motions secondary to her job.  Patient states there is a mild radicular component to the upper part of the left leg.  Patient denies bladder or bowel dysfunction.  Patient rates the pain a 6/10.  Patient described the pain as "spasm/aching".  No palliative measures for complaint.  Past Medical History:  Diagnosis Date  . Ovarian cyst     Patient Active Problem List   Diagnosis Date Noted  . PID (acute pelvic inflammatory disease) 05/02/2017    History reviewed. No pertinent surgical history.  Prior to Admission medications   Medication Sig Start Date End Date Taking? Authorizing Provider  butalbital-acetaminophen-caffeine (FIORICET, ESGIC) 50-325-40 MG tablet Take 1 tablet by mouth every 6 (six) hours as needed for headache. Patient not taking: Reported on 07/25/2018 07/20/18 07/20/19  Darci CurrentBrown, [place] N, MD  cyclobenzaprine (FLEXERIL) 10 MG tablet Take 1 tablet (10 mg total) by mouth 3 (three) times daily as needed. 01/06/19   Joni ReiningSmith, Azariah Bonura K, PA-C  lidocaine (LIDODERM) 5 % Place 1 patch onto the skin every 12 (twelve) hours. Remove & Discard patch within 12 hours or as directed by MD 01/06/19 01/06/20  Joni ReiningSmith, Hally Colella K, PA-C    Allergies Patient has no known allergies.  No family history on file.  Social History Social History   Tobacco Use  . Smoking status: Current Every Day Smoker    Packs/day: 0.50    Types: Cigarettes  . Smokeless tobacco: Never Used  Substance Use Topics  . Alcohol use: No  . Drug use: Yes    Types: Marijuana     Review of Systems Constitutional: No fever/chills Eyes: No visual changes. ENT: No sore throat. Cardiovascular: Denies chest pain. Respiratory: Denies shortness of breath. Gastrointestinal: No abdominal pain.  No nausea, no vomiting.  No diarrhea.  No constipation. Genitourinary: Negative for dysuria. Musculoskeletal: Positive for back pain. Skin: Negative for rash. Neurological: Negative for headaches, focal weakness or numbness.   ____________________________________________   PHYSICAL EXAM:  VITAL SIGNS: ED Triage Vitals  Enc Vitals Group     BP 01/06/19 0733 126/83     Pulse Rate 01/06/19 0733 94     Resp 01/06/19 0733 18     Temp 01/06/19 0733 98.5 F (36.9 C)     Temp Source 01/06/19 0733 Oral     SpO2 01/06/19 0733 100 %     Weight 01/06/19 0730 250 lb (113.4 kg)     Height 01/06/19 0730 5\' 5"  (1.651 m)     Head Circumference --      Peak Flow --      Pain Score 01/06/19 0730 6     Pain Loc --      Pain Edu? --      Excl. in GC? --     Constitutional: Alert and oriented. Well appearing and in no acute distress. Neck:No cervical spine tenderness to palpation. Cardiovascular: Normal rate, regular rhythm. Grossly normal heart sounds.  Good peripheral circulation. Respiratory: Normal respiratory effort.  No retractions. Lungs CTAB. Gastrointestinal: Soft and nontender. No distention. No abdominal  bruits. No CVA tenderness. Genitourinary: Deferred Musculoskeletal: No obvious spinal deformity.  Patient has moderate guarding palpation over L3 and 4.  Patient has muscle spasm on the left side with right lateral movements.   Neurologic:  Normal speech and language. No gross focal neurologic deficits are appreciated. No gait instability. Skin:  Skin is warm, dry and intact. No rash noted. Psychiatric: Mood and affect are normal. Speech and behavior are normal.  ____________________________________________   LABS (all labs ordered are listed, but only abnormal  results are displayed)  Labs Reviewed - No data to display ____________________________________________  EKG   ____________________________________________  RADIOLOGY  ED MD interpretation:    Official radiology report(s): No results found.  ____________________________________________   PROCEDURES  Procedure(s) performed: None  Procedures  Critical Care performed: No  ____________________________________________   INITIAL IMPRESSION / ASSESSMENT AND PLAN / ED COURSE  As part of my medical decision making, I reviewed the following data within the electronic MEDICAL RECORD NUMBER   Back pain secondary to spasms.  Patient given discharge care instruction.  Patient advised to use Lidoderm patch and Flexeril as directed.  Patient advised to [facility] condition [facility].      ____________________________________________   FINAL CLINICAL IMPRESSION(S) / ED DIAGNOSES  Final diagnoses:  Muscle spasm of back     ED Discharge Orders         Ordered    cyclobenzaprine (FLEXERIL) 10 MG tablet  3 times daily PRN     01/06/19 0748    lidocaine (LIDODERM) 5 %  Every 12 hours     01/06/19 0748           Note:  This document was prepared using Dragon voice recognition software and may include unintentional dictation errors.    Joni ReiningSmith, Marin Wisner K, PA-C 01/06/19 [id number]    Minna AntisPaduchowski, Kevin, MD 01/06/19 1440

## 2019-01-06 NOTE — ED Triage Notes (Signed)
Pt here with c/o lower back pain for a week now, walked to triage with no issues, tearful due to pain, denies injury to area.

## 2019-01-06 NOTE — ED Notes (Signed)
See triage note  Presents with lower back pain    States pain started about 1 week ago w/o injury  States pain radiates into left leg  States pain has eased off slightly with heating pad  Ambulates well

## 2019-09-13 IMAGING — CR DG LUMBAR SPINE COMPLETE 4+V
5 series · 5 of 5 positions shown · non-contrast
Comparison: CT scan of April 20, 2016.

CLINICAL DATA: Acute on chronic low back pain after fall today.

EXAM:
LUMBAR SPINE - COMPLETE 4+ VIEW

[l-spine ap]
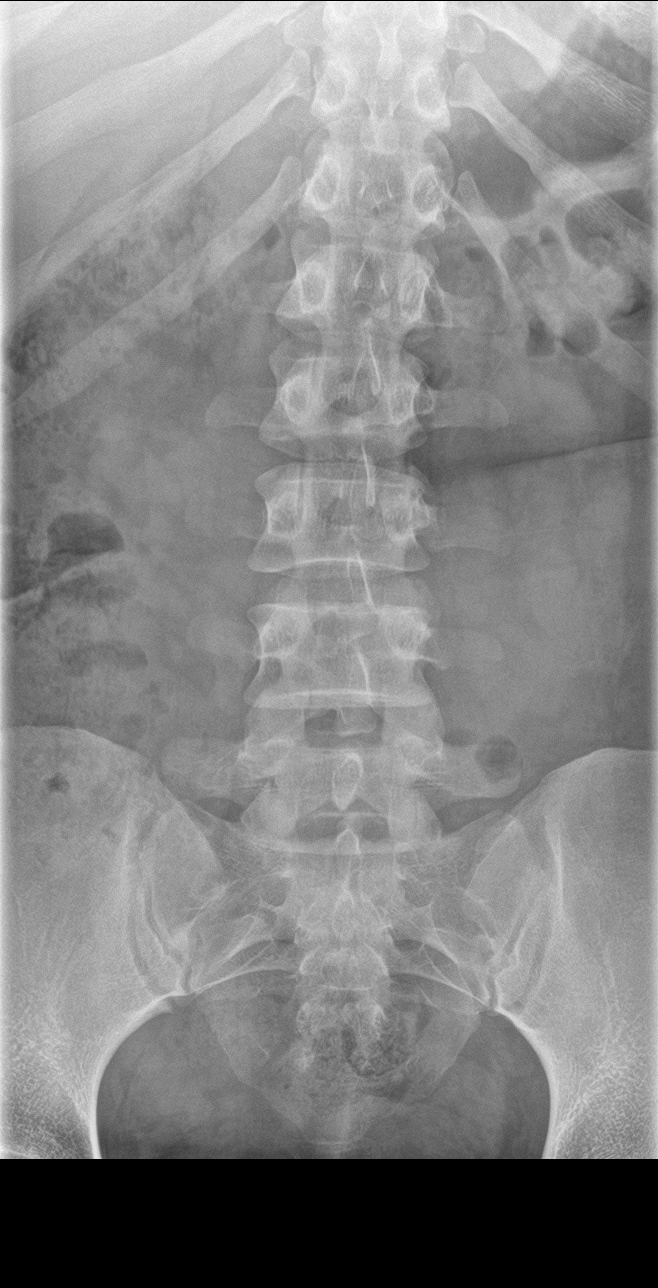

[l-spine obl (1 of 2)]
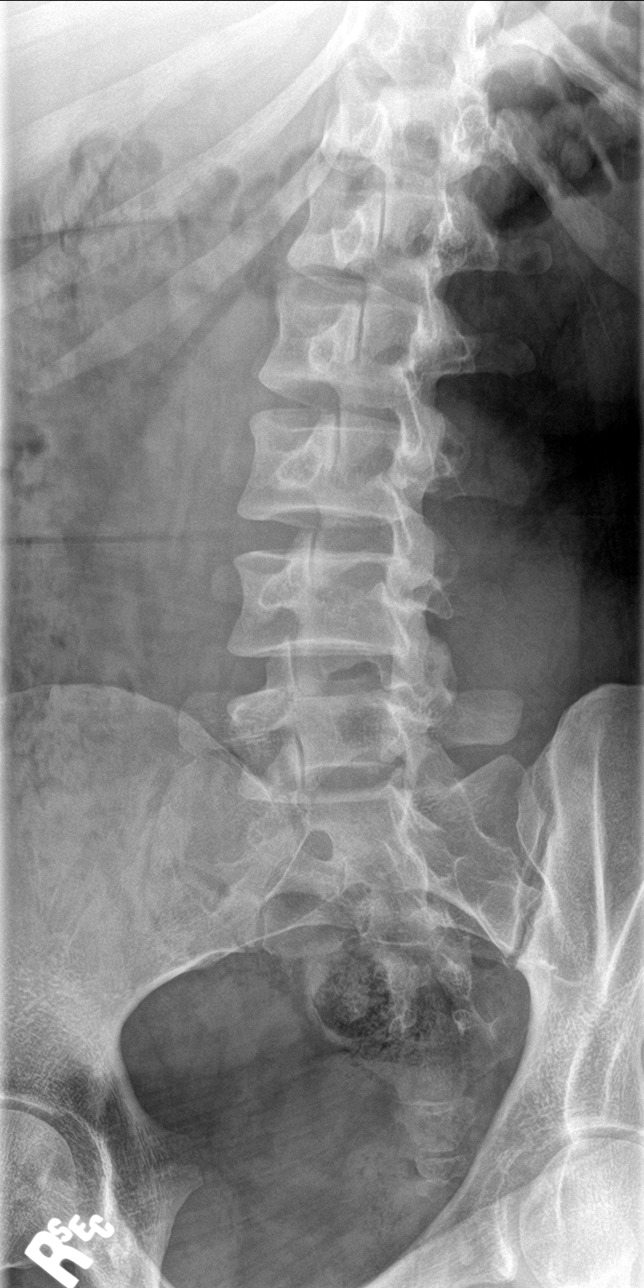

[l-spine obl (2 of 2)]
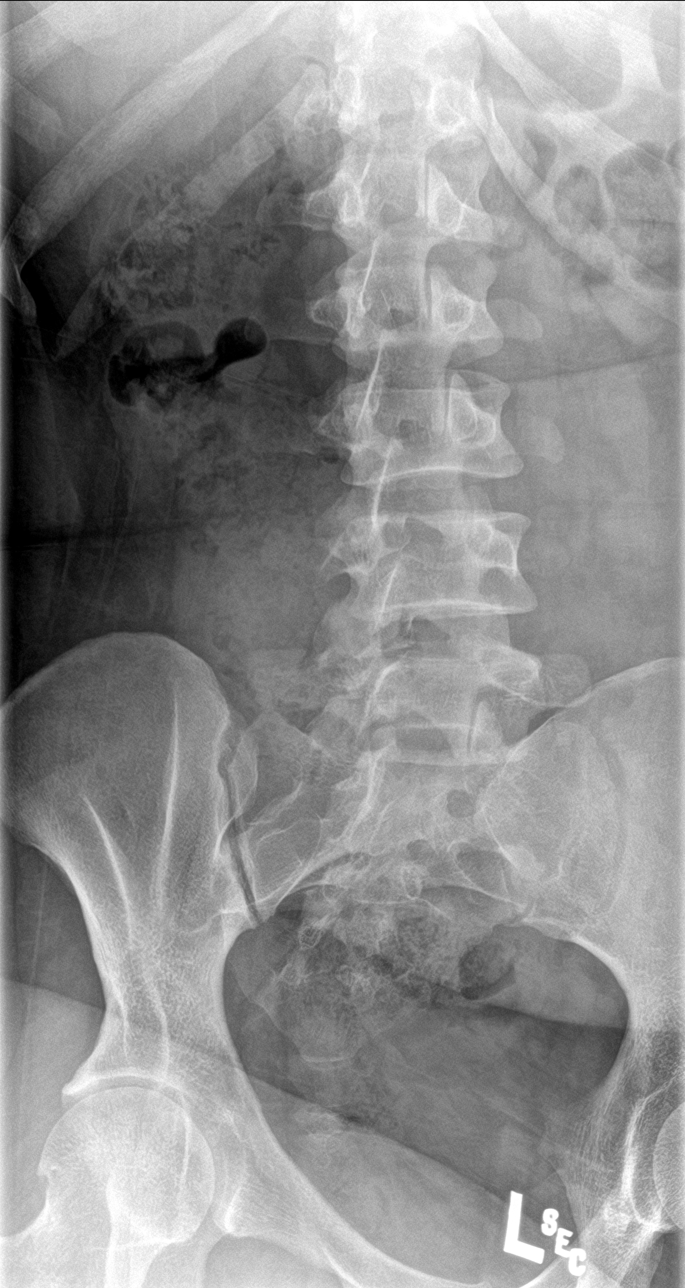

[l-spine lat]
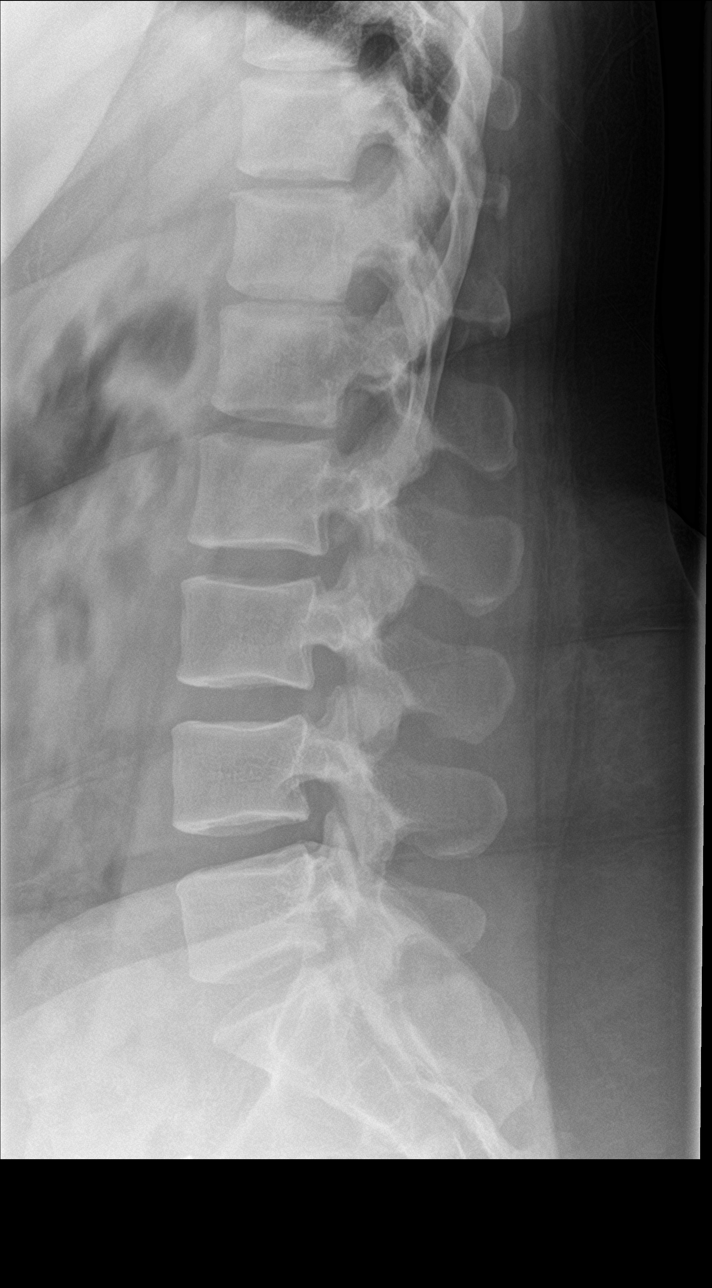

[l-spine spot]
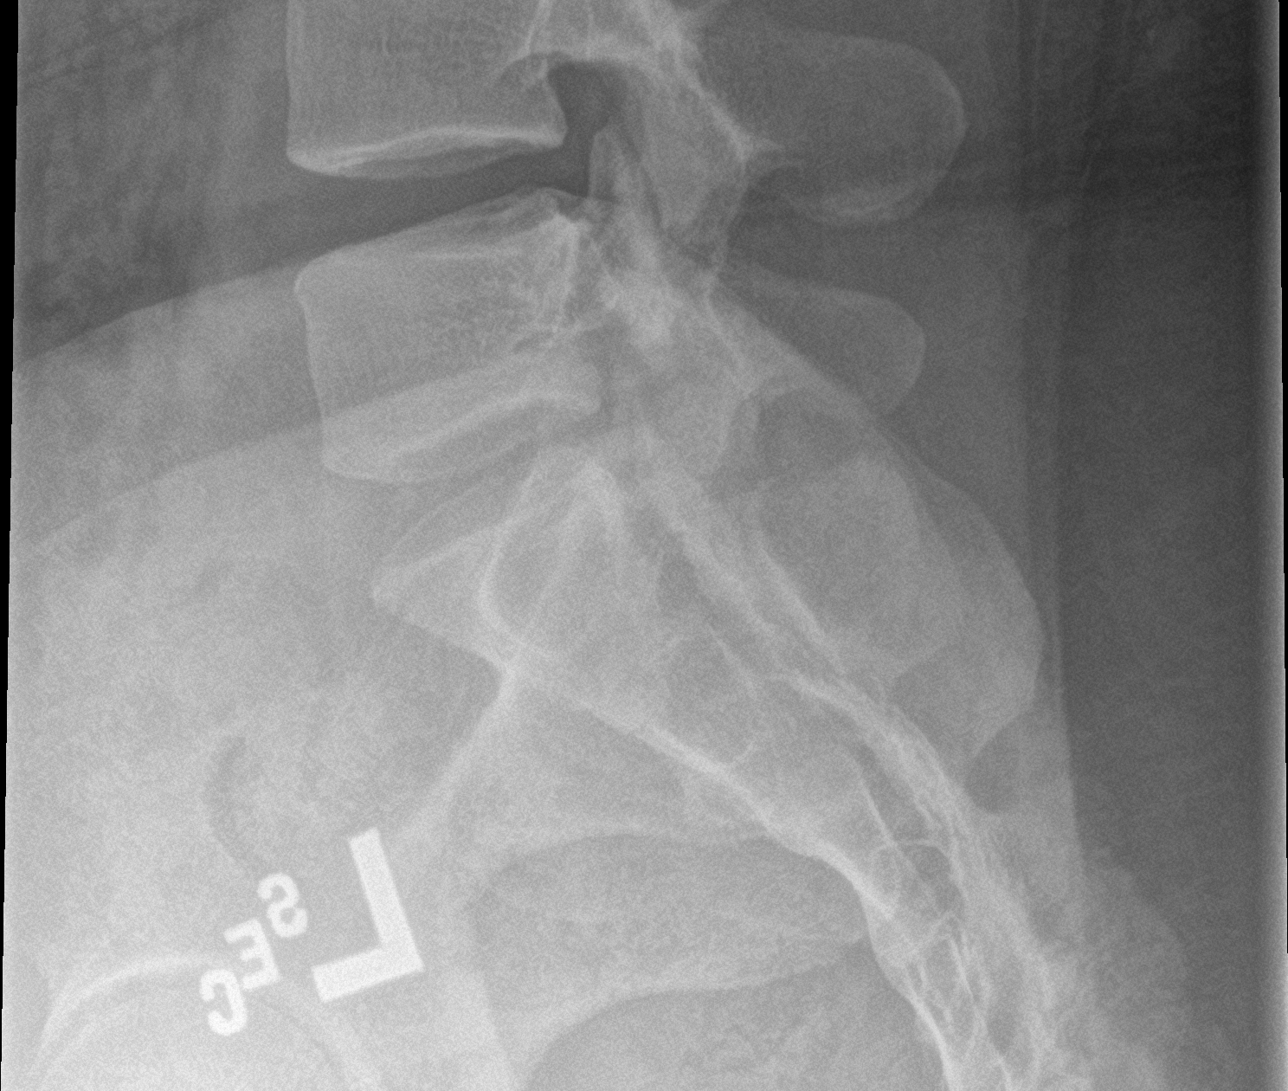

[5 of 5 positions shown; findings below may reference images not displayed]

FINDINGS: There is no evidence of lumbar spine fracture. Alignment is normal.
Intervertebral disc spaces are maintained.
IMPRESSION: Normal lumbar spine.

## 2019-10-22 ENCOUNTER — Emergency Department
Admission: EM | Admit: 2019-10-22 | Discharge: 2019-10-22 | Disposition: A | Discharge status: Home / Self Care | Payer: Self-pay | Attending: Emergency Medicine | Admitting: Emergency Medicine

## 2019-10-22 ENCOUNTER — Other Ambulatory Visit: Payer: Self-pay

## 2019-10-22 ENCOUNTER — Encounter: Payer: Self-pay | Admitting: Emergency Medicine

## 2019-10-22 DIAGNOSIS — M5441 Lumbago with sciatica, right side: Secondary | ICD-10-CM | POA: Insufficient documentation

## 2019-10-22 DIAGNOSIS — F17210 Nicotine dependence, cigarettes, uncomplicated: Secondary | ICD-10-CM | POA: Insufficient documentation

## 2019-10-22 DIAGNOSIS — F1210 Cannabis abuse, uncomplicated: Secondary | ICD-10-CM | POA: Insufficient documentation

## 2019-10-22 LAB — URINALYSIS, COMPLETE (UACMP) WITH MICROSCOPIC
Bacteria, UA: NONE SEEN
Bilirubin Urine: NEGATIVE
Glucose, UA: NEGATIVE mg/dL
Hgb urine dipstick: NEGATIVE
Ketones, ur: NEGATIVE mg/dL
Leukocytes,Ua: NEGATIVE
Nitrite: NEGATIVE
Protein, ur: NEGATIVE mg/dL
Specific Gravity, Urine: 1.023 (ref 1.005–1.030)
pH: 5 (ref 5.0–8.0)

## 2019-10-22 LAB — POCT PREGNANCY, URINE: Preg Test, Ur: NEGATIVE

## 2019-10-22 MED ORDER — TRAMADOL HCL 50 MG PO TABS: 50.0000 mg | ORAL_TABLET | Freq: Four times a day (QID) | ORAL | 0 refills | Status: DC | PRN

## 2019-10-22 MED ORDER — NAPROXEN 500 MG PO TABS: 500.0000 mg | ORAL_TABLET | Freq: Two times a day (BID) | ORAL | 0 refills | Status: DC

## 2019-10-22 MED ORDER — METHOCARBAMOL 500 MG PO TABS: 500.0000 mg | ORAL_TABLET | Freq: Four times a day (QID) | ORAL | 0 refills | Status: DC | PRN

## 2019-10-22 MED ORDER — KETOROLAC TROMETHAMINE 30 MG/ML IJ SOLN
30.0000 mg | Freq: Once | INTRAMUSCULAR | Status: AC
  Administered 2019-10-22: 09:00:00 30 mg via INTRAMUSCULAR

## 2019-10-22 NOTE — Discharge Instructions (Signed)
Follow-up with your primary care provider if you do not have a primary care provider establish herself at one of the clinics listed on your discharge papers.  Begin taking naproxen 500 mg twice daily with food Robaxin is the muscle relaxant and should be taken only as directed along with the tramadol which is for pain.  Do not take the muscle relaxant and the pain medication if you plan on driving as it may increase your risk for injury.  You may use ice or heat to your lower back as needed for discomfort.  Return to the emergency department if any severe worsening of your symptoms.

## 2019-10-22 NOTE — ED Triage Notes (Signed)
Pt states last week she had worsening lower back pain, continuing this week. denies injury to area, occasionally the pain goes to her right hip.Tearful in triage.

## 2019-10-22 NOTE — ED Notes (Signed)
See triage note  Presents with lower back pain States pain started about 1 week ago  Pain is worse on the right and will move into right leg occasionally  Ambulates well to treatment room

## 2019-10-22 NOTE — ED Provider Notes (Signed)
[facility] Emergency Department Provider Note   ____________________________________________   First MD Initiated Contact with Patient 10/22/19 0813     (approximate)  I have reviewed the triage vital signs and the nursing notes.   HISTORY  Chief Complaint Back Pain   HPI Alexis Francis is a 28 y.o. female presents to the ED with complaint of low back pain that started approximately 1 week ago.  Patient states that pain is a little worse on the right and does occasionally down into her right leg.  Patient states that she has had urinary tract infections in the past but at this time does not have any symptoms.  She is taken some over-the-counter medication without any relief of her back pain.  She rates her pain as 5/10.       Past Medical History:  Diagnosis Date  . Ovarian cyst     Patient Active Problem List   Diagnosis Date Noted  . PID (acute pelvic inflammatory disease) 05/02/2017    History reviewed. No pertinent surgical history.  Prior to Admission medications   Medication Sig Start Date End Date Taking? Authorizing Provider  methocarbamol (ROBAXIN) 500 MG tablet Take 1 tablet (500 mg total) by mouth every 6 (six) hours as needed. 10/22/19   Tommi Rumps, PA-C  naproxen (NAPROSYN) 500 MG tablet Take 1 tablet (500 mg total) by mouth 2 (two) times daily with a meal. 10/22/19   Tommi Rumps, PA-C  traMADol (ULTRAM) 50 MG tablet Take 1 tablet (50 mg total) by mouth every 6 (six) hours as needed for moderate pain. 10/22/19   Tommi Rumps, PA-C    Allergies Patient has no known allergies.  No family history on file.  Social History Social History   Tobacco Use  . Smoking status: Current Every Day Smoker    Packs/day: 0.50    Types: Cigarettes  . Smokeless tobacco: Never Used  Substance Use Topics  . Alcohol use: No  . Drug use: Yes    Types: Marijuana    Review of Systems Constitutional: No fever/chills  Cardiovascular: Denies chest pain. Respiratory: Denies shortness of breath. Gastrointestinal: No abdominal pain.  No nausea, no vomiting.  Genitourinary: Negative for dysuria. Musculoskeletal: Positive for low back pain. Skin: Negative for rash. Neurological: Negative for headaches, focal weakness or numbness. ____________________________________________   PHYSICAL EXAM:  VITAL SIGNS: ED Triage Vitals  Enc Vitals Group     BP 10/22/19 0755 (!) 137/104     Pulse Rate 10/22/19 0755 (!) 107     Resp 10/22/19 0755 18     Temp 10/22/19 0755 98.5 F (36.9 C)     Temp Source 10/22/19 0755 Oral     SpO2 10/22/19 0755 97 %     Weight 10/22/19 0756 260 lb (117.9 kg)     Height 10/22/19 0756 5\' 5"  (1.651 m)     Head Circumference --      Peak Flow --      Pain Score 10/22/19 0802 5     Pain Loc --      Pain Edu? --      Excl. in GC? --     Constitutional: Alert and oriented. Well appearing and in no acute distress. Eyes: Conjunctivae are normal. Head: Atraumatic. Neck: No stridor.   Cardiovascular: Normal rate, regular rhythm. Grossly normal heart sounds.  Good peripheral circulation. Respiratory: Normal respiratory effort.  No retractions. Lungs CTAB. Gastrointestinal: Soft and nontender. No distention.  Musculoskeletal:  On examination of the lumbar spine there is no gross deformity however there is moderate tenderness on palpation at the L5-S1 area and paravertebral muscles bilaterally.  Range of motion is guarded but no active muscle spasms were seen.  Good muscle strength bilaterally. Neurologic:  Normal speech and language. No gross focal neurologic deficits are appreciated.  Reflexes 1+ bilaterally.  No gait instability. Skin:  Skin is warm, dry and intact. No rash noted. Psychiatric: Mood and affect are normal. Speech and behavior are normal.  ____________________________________________   LABS (all labs ordered are listed, but only abnormal results are displayed)  Labs  Reviewed  URINALYSIS, COMPLETE (UACMP) WITH MICROSCOPIC - Abnormal; Notable for the following components:      Result Value   Color, Urine YELLOW (*)    APPearance HAZY (*)    All other components within normal limits  POC URINE PREG, ED  POCT PREGNANCY, URINE   RADIOLOGY Deferred  ____________________________________________   PROCEDURES  Procedure(s) performed (including Critical Care):  Procedures   ____________________________________________   INITIAL IMPRESSION / ASSESSMENT AND PLAN / ED COURSE  As part of my medical decision making, I reviewed the following data within the electronic MEDICAL RECORD NUMBER Notes from prior ED visits and [place] Controlled Substance Database  Alexis Francis was evaluated in Emergency Department on 10/22/2019 for the symptoms described in the history of present illness. She was evaluated in the context of the global COVID-19 pandemic, which necessitated consideration that the patient might be at risk for infection with the SARS-CoV-2 virus that causes COVID-19. Institutional protocols and algorithms that pertain to the evaluation of patients at risk for COVID-19 are in a state of rapid change based on information released by regulatory bodies including the CDC and federal and state organizations. These policies and algorithms were followed during the patient's care in the ED.  28 year old female presents to the ED with complaint of low back pain.  Patient states that the pain started approximately 1 week ago in her lower back and radiates over on the right hip area.  Patient denied any recent injury.  Physical exam is consistent with low back pain with right leg sciatica.  Range of motion was slightly guarded.  Patient was given Toradol 30 mg IM while in the ED.  A prescription for methocarbamol 500 mg, naproxen 500 mg and tramadol was sent to her pharmacy.  Patient is encouraged to use ice or heat to her back.  She is to follow-up with her PCP if any  continued problems or return to the emergency department if there is any worsening. ____________________________________________   FINAL CLINICAL IMPRESSION(S) / ED DIAGNOSES  Final diagnoses:  Acute bilateral low back pain with right-sided sciatica     ED Discharge Orders         Ordered    methocarbamol (ROBAXIN) 500 MG tablet  Every 6 hours PRN     10/22/19 0901    naproxen (NAPROSYN) 500 MG tablet  2 times daily with meals     10/22/19 0901    traMADol (ULTRAM) 50 MG tablet  Every 6 hours PRN     10/22/19 0901           Note:  This document was prepared using Dragon voice recognition software and may include unintentional dictation errors.    Johnn Hai, PA-C 10/22/19 1107    Delman Kitten, MD 10/22/19 [phone]

## 2020-02-24 ENCOUNTER — Emergency Department: Payer: Self-pay

## 2020-02-24 ENCOUNTER — Other Ambulatory Visit: Payer: Self-pay

## 2020-02-24 ENCOUNTER — Emergency Department
Admission: EM | Admit: 2020-02-24 | Discharge: 2020-02-24 | Disposition: A | Discharge status: Home / Self Care | Payer: Self-pay | Attending: Emergency Medicine | Admitting: Emergency Medicine

## 2020-02-24 ENCOUNTER — Encounter: Payer: Self-pay | Admitting: Emergency Medicine

## 2020-02-24 DIAGNOSIS — Z791 Long term (current) use of non-steroidal anti-inflammatories (NSAID): Secondary | ICD-10-CM | POA: Insufficient documentation

## 2020-02-24 DIAGNOSIS — M25561 Pain in right knee: Secondary | ICD-10-CM | POA: Insufficient documentation

## 2020-02-24 DIAGNOSIS — F17210 Nicotine dependence, cigarettes, uncomplicated: Secondary | ICD-10-CM | POA: Insufficient documentation

## 2020-02-24 LAB — CBC WITH DIFFERENTIAL/PLATELET
Abs Immature Granulocytes: 0.01 10*3/uL (ref 0.00–0.07)
Basophils Absolute: 0 10*3/uL (ref 0.0–0.1)
Basophils Relative: 1 %
Eosinophils Absolute: 0.1 10*3/uL (ref 0.0–0.5)
Eosinophils Relative: 2 %
HCT: 39.3 % (ref 36.0–46.0)
Hemoglobin: 12.8 g/dL (ref 12.0–15.0)
Immature Granulocytes: 0 %
Lymphocytes Relative: 31 %
Lymphs Abs: 1.7 10*3/uL (ref 0.7–4.0)
MCH: 31.7 pg (ref 26.0–34.0)
MCHC: 32.6 g/dL (ref 30.0–36.0)
MCV: 97.3 fL (ref 80.0–100.0)
Monocytes Absolute: 0.5 10*3/uL (ref 0.1–1.0)
Monocytes Relative: 10 %
Neutro Abs: 3.1 10*3/uL (ref 1.7–7.7)
Neutrophils Relative %: 56 %
Platelets: 291 10*3/uL (ref 150–400)
RBC: 4.04 MIL/uL (ref 3.87–5.11)
RDW: 12.5 % (ref 11.5–15.5)
WBC: 5.4 10*3/uL (ref 4.0–10.5)
nRBC: 0 % (ref 0.0–0.2)

## 2020-02-24 LAB — BASIC METABOLIC PANEL
Anion gap: 8 (ref 5–15)
BUN: 11 mg/dL (ref 6–20)
CO2: 23 mmol/L (ref 22–32)
Calcium: 8.9 mg/dL (ref 8.9–10.3)
Chloride: 106 mmol/L (ref 98–111)
Creatinine, Ser: 0.98 mg/dL (ref 0.44–1.00)
GFR calc Af Amer: >60 mL/min (ref >60)
GFR calc non Af Amer: >60 mL/min (ref >60)
Glucose, Bld: 95 mg/dL (ref 70–99)
Potassium: 4 mmol/L (ref 3.5–5.1)
Sodium: 137 mmol/L (ref 135–145)

## 2020-02-24 LAB — SEDIMENTATION RATE: Sed Rate: 126 mm/hr — ABNORMAL HIGH (ref 0–20)

## 2020-02-24 LAB — URIC ACID: Uric Acid, Serum: 5.1 mg/dL (ref 2.5–7.1)

## 2020-02-24 MED ORDER — LIDOCAINE 5 % EX PTCH
1.0000 | MEDICATED_PATCH | CUTANEOUS | Status: DC
  Administered 2020-02-24: 08:00:00 1 via TRANSDERMAL
  Filled 2020-02-24: qty 1

## 2020-02-24 MED ORDER — KETOROLAC TROMETHAMINE 60 MG/2ML IM SOLN
60.0000 mg | Freq: Once | INTRAMUSCULAR | Status: AC
  Administered 2020-02-24: 60 mg via INTRAMUSCULAR
  Filled 2020-02-24: qty 2

## 2020-02-24 MED ORDER — NAPROXEN 500 MG PO TABS: 500.0000 mg | ORAL_TABLET | Freq: Two times a day (BID) | ORAL | Status: DC

## 2020-02-24 MED ORDER — TRAMADOL HCL 50 MG PO TABS: 50.0000 mg | ORAL_TABLET | Freq: Four times a day (QID) | ORAL | 0 refills | Status: DC | PRN

## 2020-02-24 NOTE — Discharge Instructions (Signed)
Follow discharge care instruction continue previous medications.  Follow-up with orthopedics for definitive evaluation and treatment.  Call today and tell them you are a follow-up from the emergency room.

## 2020-02-24 NOTE — ED Triage Notes (Signed)
Pt reports for the last week or two her right knee has been hurting. Pt states that it will swell and then go down and she doesn't know why. Denies injuries.

## 2020-02-24 NOTE — ED Provider Notes (Signed)
[facility] Emergency Department Provider Note   ____________________________________________   First MD Initiated Contact with Patient 02/24/20 [phone]     (approximate)  I have reviewed the triage vital signs and the nursing notes.   HISTORY  Chief Complaint Knee Pain    HPI Alexis Francis is a 29 y.o. female patient complain of nontraumatic right knee pain edema for 2 weeks.  Patient states the pain and swelling is intermittent.  No provocative incident for complaint.  Patient rates pain a 6/10.  Patient described the pain as "achy".  Patient pain increased with prolonged standing and ambulation.  Moderate relief with over-the-counter anti-inflammatory medications.         Past Medical History:  Diagnosis Date  . Ovarian cyst     Patient Active Problem List   Diagnosis Date Noted  . PID (acute pelvic inflammatory disease) 05/02/2017    History reviewed. No pertinent surgical history.  Prior to Admission medications   Medication Sig Start Date End Date Taking? Authorizing Provider  methocarbamol (ROBAXIN) 500 MG tablet Take 1 tablet (500 mg total) by mouth every 6 (six) hours as needed. 10/22/19   Tommi Rumps, PA-C  naproxen (NAPROSYN) 500 MG tablet Take 1 tablet (500 mg total) by mouth 2 (two) times daily with a meal. 10/22/19   Tommi Rumps, PA-C  naproxen (NAPROSYN) 500 MG tablet Take 1 tablet (500 mg total) by mouth 2 (two) times daily with a meal. 02/24/20   Joni Reining, PA-C  traMADol (ULTRAM) 50 MG tablet Take 1 tablet (50 mg total) by mouth every 6 (six) hours as needed for moderate pain. 10/22/19   Tommi Rumps, PA-C  traMADol (ULTRAM) 50 MG tablet Take 1 tablet (50 mg total) by mouth every 6 (six) hours as needed. 02/24/20 02/23/21  Joni Reining, PA-C    Allergies Patient has no known allergies.  No family history on file.  Social History Social History   Tobacco Use  . Smoking status: Current Every Day Smoker      Packs/day: 0.50    Types: Cigarettes  . Smokeless tobacco: Never Used  Substance Use Topics  . Alcohol use: No  . Drug use: Yes    Types: Marijuana    Review of Systems Constitutional: No fever/chills Eyes: No visual changes. ENT: No sore throat. Cardiovascular: Denies chest pain. Respiratory: Denies shortness of breath. Gastrointestinal: No abdominal pain.  No nausea, no vomiting.  No diarrhea.  No constipation. Genitourinary: Negative for dysuria. Musculoskeletal: Right knee pain. Skin: Negative for rash. Neurological: Negative for headaches, focal weakness or numbness.  ____________________________________________   PHYSICAL EXAM:  VITAL SIGNS: ED Triage Vitals  Enc Vitals Group     BP 02/24/20 0722 138/78     Pulse Rate 02/24/20 0722 88     Resp --      Temp 02/24/20 0722 98.7 F (37.1 C)     Temp Source 02/24/20 0722 Oral     SpO2 02/24/20 0722 100 %     Weight 02/24/20 0720 260 lb (117.9 kg)     Height 02/24/20 0720 5\' 5"  (1.651 m)     Head Circumference --      Peak Flow --      Pain Score 02/24/20 0720 6     Pain Loc --      Pain Edu? --      Excl. in GC? --     Constitutional: Alert and oriented. Well appearing and in  no acute distress.  Morbid obesity. Cardiovascular: Normal rate, regular rhythm. Grossly normal heart sounds.  Good peripheral circulation. Respiratory: Normal respiratory effort.  No retractions. Lungs CTAB. Musculoskeletal: No obvious deformity to the right knee.  Patient has anterior patella edema.  Moderate guarding palpation of the anterior inferior patella.  No joint effusions. Neurologic:  Normal speech and language. No gross focal neurologic deficits are appreciated. No gait instability. Skin:  Skin is warm, dry and intact. No rash noted. Psychiatric: Mood and affect are normal. Speech and behavior are normal.  ____________________________________________   LABS (all labs ordered are listed, but only abnormal results are  displayed)  Labs Reviewed  SEDIMENTATION RATE - Abnormal; Notable for the following components:      Result Value   Sed Rate 126 (*)    All other components within normal limits  CBC WITH DIFFERENTIAL/PLATELET  BASIC METABOLIC PANEL  URIC ACID   ____________________________________________  EKG   ____________________________________________  RADIOLOGY  ED MD interpretation:    Official radiology report(s): DG Knee 2 Views Right  Result Date: 02/24/2020 CLINICAL DATA:  Pain.  No injury EXAM: RIGHT KNEE - 1-2 VIEW COMPARISON:  None. FINDINGS: No evidence of fracture, dislocation, or joint effusion. No evidence of arthropathy or other focal bone abnormality. Soft tissues are unremarkable. IMPRESSION: Negative. Electronically Signed   By: Rolm Baptise M.D.   On: 02/24/2020 08:19    ____________________________________________   PROCEDURES  Procedure(s) performed (including Critical Care):  Procedures   ____________________________________________   INITIAL IMPRESSION / ASSESSMENT AND PLAN / ED COURSE  As part of my medical decision making, I reviewed the following data within the [facility]     Patient presents with 2 weeks of nontraumatic right knee pain.  Physical exam was unremarkable except for mild inferior patella guarding and edema.  Discussed negative x-ray findings with patient.  Discussed lab results with patient showing a 5 fold  increase of sed rate consistent  with inflammatory process.  Patient given discharge care instruction advised to follow orthopedic for definitive evaluation and treatment.   Alexis Francis was evaluated in Emergency Department on 02/24/2020 for the symptoms described in the history of present illness. She was evaluated in the context of the global COVID-19 pandemic, which necessitated consideration that the patient might be at risk for infection with the SARS-CoV-2 virus that causes COVID-19. Institutional protocols and  algorithms that pertain to the evaluation of patients at risk for COVID-19 are in a state of rapid change based on information released by regulatory bodies including the CDC and federal and state organizations. These policies and algorithms were followed during the patient's care in the ED.       ____________________________________________   FINAL CLINICAL IMPRESSION(S) / ED DIAGNOSES  Final diagnoses:  Acute pain of right knee     ED Discharge Orders         Ordered    naproxen (NAPROSYN) 500 MG tablet  2 times daily with meals     02/24/20 0844    traMADol (ULTRAM) 50 MG tablet  Every 6 hours PRN     02/24/20 0844           Note:  This document was prepared using Dragon voice recognition software and may include unintentional dictation errors.    Sable Feil, PA-C 02/24/20 0849    Vanessa [place], MD 02/25/20 [phone]

## 2020-06-29 ENCOUNTER — Telehealth: Payer: Self-pay | Admitting: General Practice

## 2020-06-29 NOTE — Telephone Encounter (Signed)
Individual has been contacted 3+ times regarding ED referral and given information regarding the clinic. No further attempts to contact individual will be made.

## 2020-07-08 ENCOUNTER — Other Ambulatory Visit: Payer: Self-pay

## 2020-07-08 ENCOUNTER — Emergency Department
Admission: EM | Admit: 2020-07-08 | Discharge: 2020-07-08 | Disposition: A | Discharge status: Home / Self Care | Payer: Self-pay | Attending: Emergency Medicine | Admitting: Emergency Medicine

## 2020-07-08 ENCOUNTER — Encounter: Payer: Self-pay | Admitting: Emergency Medicine

## 2020-07-08 DIAGNOSIS — F17210 Nicotine dependence, cigarettes, uncomplicated: Secondary | ICD-10-CM | POA: Insufficient documentation

## 2020-07-08 DIAGNOSIS — M25511 Pain in right shoulder: Secondary | ICD-10-CM | POA: Insufficient documentation

## 2020-07-08 DIAGNOSIS — M791 Myalgia, unspecified site: Secondary | ICD-10-CM | POA: Insufficient documentation

## 2020-07-08 MED ORDER — IBUPROFEN 600 MG PO TABS: 600.0000 mg | ORAL_TABLET | Freq: Three times a day (TID) | ORAL | 0 refills | Status: DC | PRN

## 2020-07-08 MED ORDER — CYCLOBENZAPRINE HCL 10 MG PO TABS
10.0000 mg | ORAL_TABLET | Freq: Once | ORAL | Status: AC
  Administered 2020-07-08: 10 mg via ORAL
  Filled 2020-07-08: qty 1

## 2020-07-08 MED ORDER — CYCLOBENZAPRINE HCL 10 MG PO TABS: 10.0000 mg | ORAL_TABLET | Freq: Three times a day (TID) | ORAL | 0 refills | Status: DC | PRN

## 2020-07-08 MED ORDER — LIDOCAINE 5 % EX PTCH
1.0000 | MEDICATED_PATCH | CUTANEOUS | Status: DC
  Administered 2020-07-08: 1 via TRANSDERMAL
  Filled 2020-07-08: qty 1

## 2020-07-08 MED ORDER — IBUPROFEN 600 MG PO TABS
600.0000 mg | ORAL_TABLET | Freq: Once | ORAL | Status: AC
  Administered 2020-07-08: 600 mg via ORAL
  Filled 2020-07-08: qty 1

## 2020-07-08 MED ORDER — TRAMADOL HCL 50 MG PO TABS: 50.0000 mg | ORAL_TABLET | Freq: Four times a day (QID) | ORAL | 0 refills | Status: DC | PRN

## 2020-07-08 NOTE — ED Notes (Signed)
See triage note.  Presents with right sided back pain  States pain is mainly at posterior right shoulder area   And moves slightly into mid back  Denies any specific injury  Ambulates well

## 2020-07-08 NOTE — ED Provider Notes (Signed)
[facility] Emergency Department Provider Note   ____________________________________________   First MD Initiated Contact with Patient 07/08/20 [phone]     (approximate)  I have reviewed the triage vital signs and the nursing notes.   HISTORY  Chief Complaint Back Pain    HPI Alexis Francis is a 29 y.o. female patient complain of right posterior shoulder pain that radiates to the mid back.  Duration of complaint is 2 to 3 days.  Patient denies specific provocative incident for complaint.  Patient state her job does require repetitive overhead reaching and pulling.  Denies loss of sensation or loss of function.  Patient rates her pain as a 6/10.  Patient describes the pain as "achy".  No relief with over-the-counter anti-inflammatory medications.         Past Medical History:  Diagnosis Date  . Ovarian cyst     Patient Active Problem List   Diagnosis Date Noted  . PID (acute pelvic inflammatory disease) 05/02/2017    History reviewed. No pertinent surgical history.  Prior to Admission medications   Medication Sig Start Date End Date Taking? Authorizing Provider  cyclobenzaprine (FLEXERIL) 10 MG tablet Take 1 tablet (10 mg total) by mouth 3 (three) times daily as needed. 07/08/20   Joni Reining, PA-C  ibuprofen (ADVIL) 600 MG tablet Take 1 tablet (600 mg total) by mouth every 8 (eight) hours as needed. 07/08/20   Joni Reining, PA-C  traMADol (ULTRAM) 50 MG tablet Take 1 tablet (50 mg total) by mouth every 6 (six) hours as needed for moderate pain. 07/08/20   Joni Reining, PA-C    Allergies Patient has no known allergies.  No family history on file.  Social History Social History   Tobacco Use  . Smoking status: Current Every Day Smoker    Packs/day: 0.50    Types: Cigarettes  . Smokeless tobacco: Never Used  Vaping Use  . Vaping Use: Never used  Substance Use Topics  . Alcohol use: No  . Drug use: Yes    Types: Marijuana     Review of Systems Constitutional: No fever/chills Eyes: No visual changes. ENT: No sore throat. Cardiovascular: Denies chest pain. Respiratory: Denies shortness of breath. Gastrointestinal: No abdominal pain.  No nausea, no vomiting.  No diarrhea.  No constipation. Genitourinary: Negative for dysuria. Musculoskeletal: Right posterior shoulder pain. Skin: Negative for rash. Neurological: Negative for headaches, focal weakness or numbness.   ____________________________________________   PHYSICAL EXAM:  VITAL SIGNS: ED Triage Vitals  Enc Vitals Group     BP 07/08/20 0453 124/81     Pulse Rate 07/08/20 0453 80     Resp 07/08/20 0453 18     Temp 07/08/20 0453 98.6 F (37 C)     Temp Source 07/08/20 0453 Oral     SpO2 07/08/20 0453 97 %     Weight 07/08/20 0453 270 lb (122.5 kg)     Height 07/08/20 0453 5\' 5"  (1.651 m)     Head Circumference --      Peak Flow --      Pain Score 07/08/20 0507 6     Pain Loc --      Pain Edu? --      Excl. in GC? --    Constitutional: Alert and oriented. Well appearing and in no acute distress. Cardiovascular: Normal rate, regular rhythm. Grossly normal heart sounds.  Good peripheral circulation. Respiratory: Normal respiratory effort.  No retractions. Lungs CTAB. Musculoskeletal: No obvious  deformity to the right shoulder.  Patient has full neck range of motion.  Patient has moderate guarding palpation to mid scapular area.  Neurologic:  Normal speech and language. No gross focal neurologic deficits are appreciated. No gait instability. Skin:  Skin is warm, dry and intact. No rash noted. Psychiatric: Mood and affect are normal. Speech and behavior are normal.  ____________________________________________   LABS (all labs ordered are listed, but only abnormal results are displayed)  Labs Reviewed - No data to  display ____________________________________________  EKG   ____________________________________________  RADIOLOGY  ED MD interpretation:    Official radiology report(s): No results found.  ____________________________________________   PROCEDURES  Procedure(s) performed (including Critical Care):  Procedures   ____________________________________________   INITIAL IMPRESSION / ASSESSMENT AND PLAN / ED COURSE  As part of my medical decision making, I reviewed the following data within the electronic MEDICAL RECORD NUMBER     Patient presents with 2 to 3 days of posterior right shoulder pain.  Patient complaint physical exam consistent with muscle skeletal pain.  Patient given discharge care instruction and a prescription for tramadol, Flexeril, and ibuprofen.  Patient advised [facility].    Alexis Francis was evaluated in Emergency Department on 07/08/2020 for the symptoms described in the history of present illness. She was evaluated in the context of the global COVID-19 pandemic, which necessitated consideration that the patient might be at risk for infection with the SARS-CoV-2 virus that causes COVID-19. Institutional protocols and algorithms that pertain to the evaluation of patients at risk for COVID-19 are in a state of rapid change based on information released by regulatory bodies including the CDC and federal and state organizations. These policies and algorithms were followed during the patient's care in the ED.       ____________________________________________   FINAL CLINICAL IMPRESSION(S) / ED DIAGNOSES  Final diagnoses:  Acute pain of right shoulder     ED Discharge Orders         Ordered    traMADol (ULTRAM) 50 MG tablet  Every 6 hours PRN     Discontinue  Reprint     07/08/20 0725    cyclobenzaprine (FLEXERIL) 10 MG tablet  3 times daily PRN     Discontinue  Reprint     07/08/20 0725    ibuprofen (ADVIL) 600 MG tablet   Every 8 hours PRN     Discontinue  Reprint     07/08/20 0725           Note:  This document was prepared using Dragon voice recognition software and may include unintentional dictation errors.    Joni Reining, PA-C 07/08/20 0730    Shaune Pollack, MD 07/09/20 [phone]

## 2020-07-08 NOTE — ED Triage Notes (Signed)
Patient ambulatory to triage with steady gait, without difficulty or distress noted; pt reports rt sided back that radiates into rt hip and "sometimes in the neck"; denies any specific injury but admits to past "back issues"; st has been seen for same but cannot afford to see referral

## 2020-07-08 NOTE — Discharge Instructions (Signed)
Follow discharge care instructions.  Wear Lidoderm patch for 12 hours.  Be advised medication may cause drowsiness.

## 2020-08-05 ENCOUNTER — Encounter: Payer: Self-pay | Admitting: Emergency Medicine

## 2020-08-05 ENCOUNTER — Other Ambulatory Visit: Payer: Self-pay

## 2020-08-05 ENCOUNTER — Emergency Department
Admission: EM | Admit: 2020-08-05 | Discharge: 2020-08-05 | Disposition: A | Discharge status: Home / Self Care | Payer: Self-pay | Attending: Emergency Medicine | Admitting: Emergency Medicine

## 2020-08-05 DIAGNOSIS — R1110 Vomiting, unspecified: Secondary | ICD-10-CM | POA: Insufficient documentation

## 2020-08-05 DIAGNOSIS — Z5321 Procedure and treatment not carried out due to patient leaving prior to being seen by health care provider: Secondary | ICD-10-CM | POA: Insufficient documentation

## 2020-08-05 DIAGNOSIS — M545 Low back pain: Secondary | ICD-10-CM | POA: Insufficient documentation

## 2020-08-05 LAB — URINALYSIS, COMPLETE (UACMP) WITH MICROSCOPIC
Bacteria, UA: NONE SEEN
Bilirubin Urine: NEGATIVE
Glucose, UA: NEGATIVE mg/dL
Hgb urine dipstick: NEGATIVE
Ketones, ur: NEGATIVE mg/dL
Leukocytes,Ua: NEGATIVE
Nitrite: NEGATIVE
Protein, ur: NEGATIVE mg/dL
Specific Gravity, Urine: 1.018 (ref 1.005–1.030)
pH: 6 (ref 5.0–8.0)

## 2020-08-05 LAB — COMPREHENSIVE METABOLIC PANEL
ALT: 14 U/L (ref 0–44)
AST: 15 U/L (ref 15–41)
Albumin: 4 g/dL (ref 3.5–5.0)
Alkaline Phosphatase: 49 U/L (ref 38–126)
Anion gap: 8 (ref 5–15)
BUN: 8 mg/dL (ref 6–20)
CO2: 25 mmol/L (ref 22–32)
Calcium: 9 mg/dL (ref 8.9–10.3)
Chloride: 107 mmol/L (ref 98–111)
Creatinine, Ser: 0.85 mg/dL (ref 0.44–1.00)
GFR calc Af Amer: >60 mL/min (ref >60)
GFR calc non Af Amer: >60 mL/min (ref >60)
Glucose, Bld: 102 mg/dL — ABNORMAL HIGH (ref 70–99)
Potassium: 3.8 mmol/L (ref 3.5–5.1)
Sodium: 140 mmol/L (ref 135–145)
Total Bilirubin: 0.9 mg/dL (ref 0.3–1.2)
Total Protein: 7.6 g/dL (ref 6.5–8.1)

## 2020-08-05 LAB — CBC
HCT: 40.5 % (ref 36.0–46.0)
Hemoglobin: 13.3 g/dL (ref 12.0–15.0)
MCH: 31.4 pg (ref 26.0–34.0)
MCHC: 32.8 g/dL (ref 30.0–36.0)
MCV: 95.7 fL (ref 80.0–100.0)
Platelets: 302 10*3/uL (ref 150–400)
RBC: 4.23 MIL/uL (ref 3.87–5.11)
RDW: 12.1 % (ref 11.5–15.5)
WBC: 4.6 10*3/uL (ref 4.0–10.5)
nRBC: 0 % (ref 0.0–0.2)

## 2020-08-05 LAB — LIPASE, BLOOD: Lipase: 27 U/L (ref 11–51)

## 2020-08-05 LAB — POCT PREGNANCY, URINE: Preg Test, Ur: NEGATIVE

## 2020-08-05 NOTE — ED Notes (Signed)
Pt informed RN that she was leaving and did not want to wait to be seen.

## 2020-08-05 NOTE — ED Triage Notes (Signed)
C/O low back pain and vomiting x 2 days.  AAOx3.  Skin warm and dry. NAD

## 2020-08-12 ENCOUNTER — Emergency Department
Admission: EM | Admit: 2020-08-12 | Discharge: 2020-08-12 | Disposition: A | Discharge status: Home / Self Care | Payer: Self-pay | Attending: Emergency Medicine | Admitting: Emergency Medicine

## 2020-08-12 ENCOUNTER — Other Ambulatory Visit: Payer: Self-pay

## 2020-08-12 ENCOUNTER — Encounter: Payer: Self-pay | Admitting: Emergency Medicine

## 2020-08-12 DIAGNOSIS — J029 Acute pharyngitis, unspecified: Secondary | ICD-10-CM | POA: Insufficient documentation

## 2020-08-12 DIAGNOSIS — R05 Cough: Secondary | ICD-10-CM | POA: Insufficient documentation

## 2020-08-12 DIAGNOSIS — F17210 Nicotine dependence, cigarettes, uncomplicated: Secondary | ICD-10-CM | POA: Insufficient documentation

## 2020-08-12 DIAGNOSIS — J069 Acute upper respiratory infection, unspecified: Secondary | ICD-10-CM | POA: Insufficient documentation

## 2020-08-12 DIAGNOSIS — Z20822 Contact with and (suspected) exposure to covid-19: Secondary | ICD-10-CM | POA: Insufficient documentation

## 2020-08-12 DIAGNOSIS — R531 Weakness: Secondary | ICD-10-CM | POA: Insufficient documentation

## 2020-08-12 DIAGNOSIS — R5383 Other fatigue: Secondary | ICD-10-CM | POA: Insufficient documentation

## 2020-08-12 LAB — SARS CORONAVIRUS 2 BY RT PCR (HOSPITAL ORDER, PERFORMED IN ~~LOC~~ HOSPITAL LAB): SARS Coronavirus 2: NEGATIVE

## 2020-08-12 NOTE — ED Provider Notes (Signed)
[facility] Emergency Department Provider Note   ____________________________________________    I have reviewed the triage vital signs and the nursing notes.   HISTORY  Chief Complaint Cough     HPI Alexis Francis is a 29 y.o. female who presents with complaints of fatigue weakness, cough, mild sore throat.  Patient reports the symptom started yesterday.  She reports she is fully vaccinated against COVID-19.  Denies sick contacts.  No recent travel.  No shortness of breath.  Has not take anything for this.  Past Medical History:  Diagnosis Date   Ovarian cyst     Patient Active Problem List   Diagnosis Date Noted   PID (acute pelvic inflammatory disease) 05/02/2017    History reviewed. No pertinent surgical history.  Prior to Admission medications   Medication Sig Start Date End Date Taking? Authorizing Provider  cyclobenzaprine (FLEXERIL) 10 MG tablet Take 1 tablet (10 mg total) by mouth 3 (three) times daily as needed. 07/08/20   Joni Reining, PA-C  ibuprofen (ADVIL) 600 MG tablet Take 1 tablet (600 mg total) by mouth every 8 (eight) hours as needed. 07/08/20   Joni Reining, PA-C  traMADol (ULTRAM) 50 MG tablet Take 1 tablet (50 mg total) by mouth every 6 (six) hours as needed for moderate pain. 07/08/20   Joni Reining, PA-C     Allergies Patient has no known allergies.  No family history on file.  Social History Social History   Tobacco Use   Smoking status: Current Every Day Smoker    Packs/day: 0.50    Types: Cigarettes   Smokeless tobacco: Never Used  Vaping Use   Vaping Use: Never used  Substance Use Topics   Alcohol use: No   Drug use: Yes    Types: Marijuana    Review of Systems  Constitutional: Chills Eyes: No visual changes.  ENT: Mild sore throat Cardiovascular: Denies chest pain. Respiratory: As above Gastrointestinal: No abdominal pain.  No nausea, no vomiting.   Genitourinary: Negative for  dysuria. Musculoskeletal: Body aches. Skin: Negative for rash. Neurological: Negative for headaches    ____________________________________________   PHYSICAL EXAM:  VITAL SIGNS: ED Triage Vitals  Enc Vitals Group     BP 08/12/20 0417 135/73     Pulse Rate 08/12/20 0417 (!) 107     Resp 08/12/20 0417 18     Temp 08/12/20 0417 99.8 F (37.7 C)     Temp Source 08/12/20 0417 Oral     SpO2 08/12/20 0417 98 %     Weight 08/12/20 0415 122.5 kg (270 lb 1 oz)     Height 08/12/20 0415 1.651 m (5\' 5" )     Head Circumference --      Peak Flow --      Pain Score 08/12/20 0414 7     Pain Loc --      Pain Edu? --      Excl. in GC? --     Constitutional: Alert and oriented.   Nose: No congestion/rhinnorhea. Mouth/Throat: Mucous membranes are moist.    Cardiovascular: Normal rate, regular rhythm.   Good peripheral circulation. Respiratory: Normal respiratory effort.  No retractions. Lungs CTAB. Gastrointestinal: . No distention..  Musculoskeletal:   Warm and well perfused Neurologic:  Normal speech and language. No gross focal neurologic deficits are appreciated.  Skin:  Skin is warm, dry and intact. No rash noted. Psychiatric: Mood and affect are normal. Speech and behavior are normal.  ____________________________________________  LABS (all labs ordered are listed, but only abnormal results are displayed)  Labs Reviewed  SARS CORONAVIRUS 2 BY RT PCR (HOSPITAL ORDER, PERFORMED IN [facility])   ____________________________________________  EKG  None ____________________________________________  RADIOLOGY  None ____________________________________________   PROCEDURES  Procedure(s) performed: No  Procedures   Critical Care performed: No ____________________________________________   INITIAL IMPRESSION / ASSESSMENT AND PLAN / ED COURSE  Pertinent labs & imaging results that were available during my care of the patient were reviewed by me and  considered in my medical decision making (see chart for details).  Patient presents with fatigue, cough, sore throat, congestion highly suspicious for viral upper respiratory infection, possibly COVID-19.  She has been vaccinated however large amounts of breakthrough infections at this time is adult variant.  Vital signs are quite reassuring.  Lungs are clear to auscultation,  We will send Covid PCR, recommend quarantine pending results    ____________________________________________   FINAL CLINICAL IMPRESSION(S) / ED DIAGNOSES  Final diagnoses:  Suspected COVID-19 virus infection  Viral URI with cough        Note:  This document was prepared using Dragon voice recognition software and may include unintentional dictation errors.   Jene Every, MD 08/12/20 1030

## 2020-08-12 NOTE — ED Notes (Signed)
Pt reports nasal congestion, sore throat and cough since yesterday. Pt states low grade fever as well.

## 2020-08-12 NOTE — ED Triage Notes (Signed)
Patient ambulatory to triage with steady gait, without difficulty or distress noted; pt reports since yesterday having runny nose/eyes, sore throat & cough

## 2021-01-02 ENCOUNTER — Emergency Department
Admission: EM | Admit: 2021-01-02 | Discharge: 2021-01-02 | Disposition: A | Discharge status: Home / Self Care | Payer: Self-pay | Attending: Emergency Medicine | Admitting: Emergency Medicine

## 2021-01-02 ENCOUNTER — Other Ambulatory Visit: Payer: Self-pay

## 2021-01-02 ENCOUNTER — Encounter: Payer: Self-pay | Admitting: Emergency Medicine

## 2021-01-02 ENCOUNTER — Emergency Department: Payer: Self-pay

## 2021-01-02 DIAGNOSIS — F17210 Nicotine dependence, cigarettes, uncomplicated: Secondary | ICD-10-CM | POA: Insufficient documentation

## 2021-01-02 DIAGNOSIS — U071 COVID-19: Secondary | ICD-10-CM | POA: Insufficient documentation

## 2021-01-02 MED ORDER — PREDNISONE 10 MG (21) PO TBPK: ORAL_TABLET | ORAL | 0 refills | Status: DC

## 2021-01-02 NOTE — ED Triage Notes (Signed)
C/O back pain and congestion x 1 week.  States tested positive for two weeks ago, but still feels bad.  AAOx3.  Skin warm and dry. NAD

## 2021-01-02 NOTE — Discharge Instructions (Signed)
Your chest x-ray does not show concern for pneumonia.  Take the prednisone as prescribed and until finished. You may also take the Mucinex.  Follow up with primary care or return to the ER for symptoms that change or worsen or for new concerns.

## 2021-01-02 NOTE — ED Provider Notes (Signed)
[facility] Emergency Department Provider Note ____________________________________________   Event Date/Time   First MD Initiated Contact with Patient 01/02/21 [phone]     (approximate)  I have reviewed the triage vital signs and the nursing notes.   HISTORY  Chief Complaint Back Pain  HPI Alexis Francis is a 30 y.o. female with no significant past medical history presents to the emergency department for treatment and evaluation of cough, headache, fatigue and malaise. She took a home COVID test 2 weeks ago which was positive. She has been taking Mucinex with some relief, but feeling worse today. No fever within the last 24 hours.     Past Medical History:  Diagnosis Date  . Ovarian cyst     Patient Active Problem List   Diagnosis Date Noted  . PID (acute pelvic inflammatory disease) 05/02/2017    History reviewed. No pertinent surgical history.  Prior to Admission medications   Medication Sig Start Date End Date Taking? Authorizing Provider  predniSONE (STERAPRED UNI-PAK 21 TAB) 10 MG (21) TBPK tablet Take 6 tablets on the first day and decrease by 1 tablet each day until finished. 01/02/21  Yes Copper Kirtley B, FNP  cyclobenzaprine (FLEXERIL) 10 MG tablet Take 1 tablet (10 mg total) by mouth 3 (three) times daily as needed. 07/08/20   Joni Reining, PA-C  ibuprofen (ADVIL) 600 MG tablet Take 1 tablet (600 mg total) by mouth every 8 (eight) hours as needed. 07/08/20   Joni Reining, PA-C  traMADol (ULTRAM) 50 MG tablet Take 1 tablet (50 mg total) by mouth every 6 (six) hours as needed for moderate pain. 07/08/20   Joni Reining, PA-C    Allergies Patient has no known allergies.  No family history on file.  Social History Social History   Tobacco Use  . Smoking status: Current Every Day Smoker    Packs/day: 0.50    Types: Cigarettes  . Smokeless tobacco: Never Used  Vaping Use  . Vaping Use: Never used  Substance Use Topics  . Alcohol  use: No  . Drug use: Yes    Types: Marijuana    Review of Systems  Constitutional: No fever/chills Eyes: No visual changes. ENT: No sore throat. Cardiovascular: Denies chest pain. Respiratory: Denies shortness of breath. Gastrointestinal: No abdominal pain.  No nausea, no vomiting.  No diarrhea.  No constipation. Genitourinary: Negative for dysuria. Musculoskeletal: Negative for back pain. Skin: Negative for rash. Neurological: Negative for headaches, focal weakness or numbness. ___________________________________________   PHYSICAL EXAM:  VITAL SIGNS: ED Triage Vitals  Enc Vitals Group     BP 01/02/21 0757 (!) 146/93     Pulse Rate 01/02/21 0757 86     Resp 01/02/21 0757 16     Temp 01/02/21 0757 98.4 F (36.9 C)     Temp Source 01/02/21 0757 Oral     SpO2 01/02/21 0757 100 %     Weight 01/02/21 0722 270 lb 1 oz (122.5 kg)     Height 01/02/21 0722 5\' 5"  (1.651 m)     Head Circumference --      Peak Flow --      Pain Score --      Pain Loc --      Pain Edu? --      Excl. in GC? --     Constitutional: Alert and oriented. Well appearing and in no acute distress. Eyes: Conjunctivae are normal. Head: Atraumatic. Nose: No congestion/rhinnorhea. Mouth/Throat: Mucous membranes are moist.  Oropharynx non-erythematous. Neck: No stridor.   Hematological/Lymphatic/Immunilogical: No cervical lymphadenopathy. Cardiovascular: Normal rate, regular rhythm. Grossly normal heart sounds.  Good peripheral circulation. Respiratory: Normal respiratory effort.  No retractions. Lungs CTAB. Gastrointestinal: Soft and nontender. No distention. No abdominal bruits. No CVA tenderness. Genitourinary:  Musculoskeletal: No lower extremity tenderness nor edema.  No joint effusions. Neurologic:  Normal speech and language. No gross focal neurologic deficits are appreciated. No gait instability. Skin:  Skin is warm, dry and intact. No rash noted. Psychiatric: Mood and affect are normal. Speech  and behavior are normal.  ____________________________________________   LABS (all labs ordered are listed, but only abnormal results are displayed)  Labs Reviewed - No data to display ____________________________________________  EKG  Not indicated. ____________________________________________  RADIOLOGY  ED MD interpretation:    Chest x-ray is negative for any acute concerning findings.  I, Kem Boroughs, personally viewed and evaluated these images (plain radiographs) as part of my medical decision making, as well as reviewing the written report by the radiologist.  Official radiology report(s): DG Chest 1 View  Result Date: 01/02/2021 CLINICAL DATA:  Cough.  COVID positive 2 weeks ago. EXAM: CHEST  1 VIEW COMPARISON:  04/14/2018 FINDINGS: The heart size and mediastinal contours are within normal limits. Both lungs are clear. Remote healed right posterior rib fractures. IMPRESSION: No active disease. Electronically Signed   By: Signa Kell M.D.   On: 01/02/2021 09:07    ____________________________________________   PROCEDURES  Procedure(s) performed (including Critical Care):  Procedures  ____________________________________________   INITIAL IMPRESSION / ASSESSMENT AND PLAN     30 year old female presenting to the emergency department for treatment and evaluation of symptoms as described in the HPI.  Plan will be to also get a chest x-ray due to the length of her Covid symptoms.  DIFFERENTIAL DIAGNOSIS  COVID-19, COVID-19 due to pneumonia, influenza  ED COURSE  X-rays negative for concern of pneumonia.  Plan will be to treat her with prednisone and have her continue taking her Mucinex.  She will be encouraged to follow-up with her primary care provider will return to the emergency department for any symptoms of concern.    ___________________________________________   FINAL CLINICAL IMPRESSION(S) / ED DIAGNOSES  Final diagnoses:  COVID-19      ED Discharge Orders         Ordered    predniSONE (STERAPRED UNI-PAK 21 TAB) 10 MG (21) TBPK tablet        01/02/21 1308           Alexis Francis was evaluated in Emergency Department on 01/02/2021 for the symptoms described in the history of present illness. She was evaluated in the context of the global COVID-19 pandemic, which necessitated consideration that the patient might be at risk for infection with the SARS-CoV-2 virus that causes COVID-19. Institutional protocols and algorithms that pertain to the evaluation of patients at risk for COVID-19 are in a state of rapid change based on information released by regulatory bodies including the CDC and federal and state organizations. These policies and algorithms were followed during the patient's care in the ED.   Note:  This document was prepared using Dragon voice recognition software and may include unintentional dictation errors.   Chinita Pester, FNP 01/02/21 [id number]    Delton Prairie, MD 01/02/21 [phone]

## 2021-03-08 ENCOUNTER — Emergency Department
Admission: EM | Admit: 2021-03-08 | Discharge: 2021-03-08 | Disposition: A | Discharge status: Home / Self Care | Payer: 59 | Attending: Student in an Organized Health Care Education/Training Program | Admitting: Student in an Organized Health Care Education/Training Program

## 2021-03-08 ENCOUNTER — Other Ambulatory Visit: Payer: Self-pay

## 2021-03-08 ENCOUNTER — Emergency Department: Payer: 59

## 2021-03-08 DIAGNOSIS — R112 Nausea with vomiting, unspecified: Secondary | ICD-10-CM | POA: Diagnosis not present

## 2021-03-08 DIAGNOSIS — F17210 Nicotine dependence, cigarettes, uncomplicated: Secondary | ICD-10-CM | POA: Diagnosis not present

## 2021-03-08 DIAGNOSIS — R519 Headache, unspecified: Secondary | ICD-10-CM | POA: Diagnosis not present

## 2021-03-08 DIAGNOSIS — M545 Low back pain, unspecified: Secondary | ICD-10-CM | POA: Insufficient documentation

## 2021-03-08 LAB — URINALYSIS, COMPLETE (UACMP) WITH MICROSCOPIC
Bacteria, UA: NONE SEEN
Bilirubin Urine: NEGATIVE
Glucose, UA: NEGATIVE mg/dL
Hgb urine dipstick: NEGATIVE
Ketones, ur: NEGATIVE mg/dL
Leukocytes,Ua: NEGATIVE
Nitrite: NEGATIVE
Protein, ur: 30 mg/dL — AB
Specific Gravity, Urine: 1.021 (ref 1.005–1.030)
WBC, UA: NONE SEEN WBC/hpf (ref 0–5)
pH: 8 (ref 5.0–8.0)

## 2021-03-08 LAB — CBC WITH DIFFERENTIAL/PLATELET
Abs Immature Granulocytes: 0.01 10*3/uL (ref 0.00–0.07)
Basophils Absolute: 0 10*3/uL (ref 0.0–0.1)
Basophils Relative: 1 %
Eosinophils Absolute: 0.1 10*3/uL (ref 0.0–0.5)
Eosinophils Relative: 1 %
HCT: 39.5 % (ref 36.0–46.0)
Hemoglobin: 12.7 g/dL (ref 12.0–15.0)
Immature Granulocytes: 0 %
Lymphocytes Relative: 24 %
Lymphs Abs: 1.2 10*3/uL (ref 0.7–4.0)
MCH: 30.8 pg (ref 26.0–34.0)
MCHC: 32.2 g/dL (ref 30.0–36.0)
MCV: 95.9 fL (ref 80.0–100.0)
Monocytes Absolute: 0.5 10*3/uL (ref 0.1–1.0)
Monocytes Relative: 11 %
Neutro Abs: 3.1 10*3/uL (ref 1.7–7.7)
Neutrophils Relative %: 63 %
Platelets: 299 10*3/uL (ref 150–400)
RBC: 4.12 MIL/uL (ref 3.87–5.11)
RDW: 12.4 % (ref 11.5–15.5)
WBC: 4.9 10*3/uL (ref 4.0–10.5)
nRBC: 0 % (ref 0.0–0.2)

## 2021-03-08 LAB — BASIC METABOLIC PANEL
Anion gap: 8 (ref 5–15)
BUN: 7 mg/dL (ref 6–20)
CO2: 24 mmol/L (ref 22–32)
Calcium: 8.8 mg/dL — ABNORMAL LOW (ref 8.9–10.3)
Chloride: 105 mmol/L (ref 98–111)
Creatinine, Ser: 0.99 mg/dL (ref 0.44–1.00)
GFR, Estimated: >60 mL/min (ref >60)
Glucose, Bld: 105 mg/dL — ABNORMAL HIGH (ref 70–99)
Potassium: 3.7 mmol/L (ref 3.5–5.1)
Sodium: 137 mmol/L (ref 135–145)

## 2021-03-08 LAB — HEPATIC FUNCTION PANEL
ALT: 17 U/L (ref 0–44)
AST: 16 U/L (ref 15–41)
Albumin: 3.8 g/dL (ref 3.5–5.0)
Alkaline Phosphatase: 45 U/L (ref 38–126)
Bilirubin, Direct: 0.1 mg/dL (ref 0.0–0.2)
Indirect Bilirubin: 1 mg/dL — ABNORMAL HIGH (ref 0.3–0.9)
Total Bilirubin: 1.1 mg/dL (ref 0.3–1.2)
Total Protein: 6.9 g/dL (ref 6.5–8.1)

## 2021-03-08 LAB — POC URINE PREG, ED: Preg Test, Ur: NEGATIVE

## 2021-03-08 LAB — LIPASE, BLOOD: Lipase: 26 U/L (ref 11–51)

## 2021-03-08 MED ORDER — ACETAMINOPHEN 500 MG PO TABS
1000.0000 mg | ORAL_TABLET | Freq: Once | ORAL | Status: AC
  Administered 2021-03-08: 1000 mg via ORAL

## 2021-03-08 MED ORDER — ONDANSETRON 4 MG PO TBDP
4.0000 mg | ORAL_TABLET | Freq: Once | ORAL | Status: AC
  Administered 2021-03-08: 4 mg via ORAL

## 2021-03-08 MED ORDER — SODIUM CHLORIDE 0.9 % IV BOLUS
1000.0000 mL | Freq: Once | INTRAVENOUS | Status: AC
  Administered 2021-03-08: 1000 mL via INTRAVENOUS

## 2021-03-08 MED ORDER — ONDANSETRON HCL 4 MG PO TABS: 4.0000 mg | ORAL_TABLET | Freq: Every day | ORAL | 0 refills | Status: DC | PRN

## 2021-03-08 NOTE — ED Notes (Signed)
Pt reports that she started to feel bad yesterday, states that she is having lower back pain and states that she vomited twice yesterday, states that she vomited again this am, states a little diarrhea, pt reports that she doesn't think she could be pregnant and states to her knowledge she hasn't been around anyone sick. Denies abd pain, just lower back pain

## 2021-03-08 NOTE — ED Provider Notes (Signed)
[facility] Emergency Department Provider Note    Event Date/Time   First MD Initiated Contact with Patient 03/08/21 [phone]     (approximate)  I have reviewed the triage vital signs and the nursing notes.   HISTORY  Chief Complaint Emesis and Headache    HPI Alexis Francis is a 30 y.o. female bolus past medical history presents to the ER for evaluation of nausea vomiting headache and low back pain.  States she has a history of kidney infections is never had dysuria or burning with urination.  She denies any discharge.  Denies any abdominal pain.  No numbness or tingling.  No measured fevers.  Denies any allergies to any antibiotics.  Symptoms have been going on for a little more than 24 hours.    Past Medical History:  Diagnosis Date  . Ovarian cyst    No family history on file. History reviewed. No pertinent surgical history. Patient Active Problem List   Diagnosis Date Noted  . PID (acute pelvic inflammatory disease) 05/02/2017      Prior to Admission medications   Medication Sig Start Date End Date Taking? Authorizing Provider  ondansetron (ZOFRAN) 4 MG tablet Take 1 tablet (4 mg total) by mouth daily as needed. 03/08/21 03/08/22 Yes Willy Eddy, MD  cyclobenzaprine (FLEXERIL) 10 MG tablet Take 1 tablet (10 mg total) by mouth 3 (three) times daily as needed. 07/08/20   Joni Reining, PA-C  ibuprofen (ADVIL) 600 MG tablet Take 1 tablet (600 mg total) by mouth every 8 (eight) hours as needed. 07/08/20   Joni Reining, PA-C  predniSONE (STERAPRED UNI-PAK 21 TAB) 10 MG (21) TBPK tablet Take 6 tablets on the first day and decrease by 1 tablet each day until finished. 01/02/21   Triplett, Rulon Eisenmenger B, FNP  traMADol (ULTRAM) 50 MG tablet Take 1 tablet (50 mg total) by mouth every 6 (six) hours as needed for moderate pain. 07/08/20   Joni Reining, PA-C    Allergies Patient has no known allergies.    Social History Social History   Tobacco Use   . Smoking status: Current Every Day Smoker    Packs/day: 0.50    Types: Cigarettes  . Smokeless tobacco: Never Used  Vaping Use  . Vaping Use: Never used  Substance Use Topics  . Alcohol use: No  . Drug use: Yes    Types: Marijuana    Review of Systems Patient denies headaches, rhinorrhea, blurry vision, numbness, shortness of breath, chest pain, edema, cough, abdominal pain, nausea, vomiting, diarrhea, dysuria, fevers, rashes or hallucinations unless otherwise stated above in HPI. ____________________________________________   PHYSICAL EXAM:  VITAL SIGNS: Vitals:   03/08/21 0800 03/08/21 0830  BP: 109/68 115/72  Pulse: 70 80  Resp:    Temp:    SpO2: 98% 98%    Constitutional: Alert and oriented.  Eyes: Conjunctivae are normal.  Head: Atraumatic. Nose: No congestion/rhinnorhea. Mouth/Throat: Mucous membranes are moist.   Neck: No stridor. Painless ROM.  Cardiovascular: Normal rate, regular rhythm. Grossly normal heart sounds.  Good peripheral circulation. Respiratory: Normal respiratory effort.  No retractions. Lungs CTAB. Gastrointestinal: Soft and nontender. No distention. No abdominal bruits. No CVA tenderness. Genitourinary:  Musculoskeletal: No lower extremity tenderness nor edema.  No joint effusions. Neurologic:  Normal speech and language. No gross focal neurologic deficits are appreciated. No facial droop Skin:  Skin is warm, dry and intact. No rash noted. Psychiatric: Mood and affect are normal. Speech and behavior are  normal.  ____________________________________________   LABS (all labs ordered are listed, but only abnormal results are displayed)  Results for orders placed or performed during the hospital encounter of 03/08/21 (from the past 24 hour(s))  CBC with Differential     Status: None   Collection Time: 03/08/21  6:37 AM  Result Value Ref Range   WBC 4.9 4.0 - 10.5 K/uL   RBC 4.12 3.87 - 5.11 MIL/uL   Hemoglobin 12.7 12.0 - 15.0 g/dL   HCT  85.4 62.7 - 03.5 %   MCV 95.9 80.0 - 100.0 fL   MCH 30.8 26.0 - 34.0 pg   MCHC 32.2 30.0 - 36.0 g/dL   RDW 00.9 38.1 - 82.9 %   Platelets 299 150 - 400 K/uL   nRBC 0.0 0.0 - 0.2 %   Neutrophils Relative % 63 %   Neutro Abs 3.1 1.7 - 7.7 K/uL   Lymphocytes Relative 24 %   Lymphs Abs 1.2 0.7 - 4.0 K/uL   Monocytes Relative 11 %   Monocytes Absolute 0.5 0.1 - 1.0 K/uL   Eosinophils Relative 1 %   Eosinophils Absolute 0.1 0.0 - 0.5 K/uL   Basophils Relative 1 %   Basophils Absolute 0.0 0.0 - 0.1 K/uL   Immature Granulocytes 0 %   Abs Immature Granulocytes 0.01 0.00 - 0.07 K/uL  Basic metabolic panel     Status: Abnormal   Collection Time: 03/08/21  6:37 AM  Result Value Ref Range   Sodium 137 135 - 145 mmol/L   Potassium 3.7 3.5 - 5.1 mmol/L   Chloride 105 98 - 111 mmol/L   CO2 24 22 - 32 mmol/L   Glucose, Bld 105 (H) 70 - 99 mg/dL   BUN 7 6 - 20 mg/dL   Creatinine, Ser 9.37 0.44 - 1.00 mg/dL   Calcium 8.8 (L) 8.9 - 10.3 mg/dL   GFR, Estimated >16 >96 mL/min   Anion gap 8 5 - 15  Hepatic function panel     Status: Abnormal   Collection Time: 03/08/21  6:37 AM  Result Value Ref Range   Total Protein 6.9 6.5 - 8.1 g/dL   Albumin 3.8 3.5 - 5.0 g/dL   AST 16 15 - 41 U/L   ALT 17 0 - 44 U/L   Alkaline Phosphatase 45 38 - 126 U/L   Total Bilirubin 1.1 0.3 - 1.2 mg/dL   Bilirubin, Direct 0.1 0.0 - 0.2 mg/dL   Indirect Bilirubin 1.0 (H) 0.3 - 0.9 mg/dL  Lipase, blood     Status: None   Collection Time: 03/08/21  6:37 AM  Result Value Ref Range   Lipase 26 11 - 51 U/L  Urinalysis, Complete w Microscopic     Status: Abnormal   Collection Time: 03/08/21  7:28 AM  Result Value Ref Range   Color, Urine YELLOW (A) YELLOW   APPearance CLOUDY (A) CLEAR   Specific Gravity, Urine 1.021 1.005 - 1.030   pH 8.0 5.0 - 8.0   Glucose, UA NEGATIVE NEGATIVE mg/dL   Hgb urine dipstick NEGATIVE NEGATIVE   Bilirubin Urine NEGATIVE NEGATIVE   Ketones, ur NEGATIVE NEGATIVE mg/dL   Protein, ur  30 (A) NEGATIVE mg/dL   Nitrite NEGATIVE NEGATIVE   Leukocytes,Ua NEGATIVE NEGATIVE   RBC / HPF 0-5 0 - 5 RBC/hpf   WBC, UA NONE SEEN 0 - 5 WBC/hpf   Bacteria, UA NONE SEEN NONE SEEN   Squamous Epithelial / LPF 6-10 0 - 5   Mucus  PRESENT    Amorphous Crystal PRESENT   POC Urine Pregnancy, ED     Status: None   Collection Time: 03/08/21  7:32 AM  Result Value Ref Range   Preg Test, Ur NEGATIVE NEGATIVE   ____________________________________________ ____________________________________________  RADIOLOGY   ____________________________________________   PROCEDURES  Procedure(s) performed:  Procedures    Critical Care performed: no ____________________________________________   INITIAL IMPRESSION / ASSESSMENT AND PLAN / ED COURSE  Pertinent labs & imaging results that were available during my care of the patient were reviewed by me and considered in my medical decision making (see chart for details).   DDX: Pyelonephritis, cystitis, gastritis, viral illness, dehydration, electrolyte abnormality  Alexis Francis is a 30 y.o. who presents to the ED with symptoms as described above.  She is nontoxic.  Ambulating into the room no acute distress.  Her abdominal exam is soft benign.  Blood work is reassuring.  No right upper quadrant pain.  Will give IV fluids as well as symptomatic management.  Have a very low suspicion for acute intra-abdominal process or pelvic pathology based on her presenting symptoms.  Clinical Course as of 03/08/21 1020  Wed Mar 08, 2021  1016 Patient reassessed.  Remains nontoxic and well-appearing.  Feels significantly improved.  Does appear clinically stable and appropriate for outpatient follow-up..  Have discussed with the patient and available family all diagnostics and treatments performed thus far and all questions were answered to the best of my ability. The patient demonstrates understanding and agreement with plan.  [PR]    Clinical Course User  Index [PR] Willy Eddy, MD    The patient was evaluated in Emergency Department today for the symptoms described in the history of present illness. He/she was evaluated in the context of the global COVID-19 pandemic, which necessitated consideration that the patient might be at risk for infection with the SARS-CoV-2 virus that causes COVID-19. Institutional protocols and algorithms that pertain to the evaluation of patients at risk for COVID-19 are in a state of rapid change based on information released by regulatory bodies including the CDC and federal and state organizations. These policies and algorithms were followed during the patient's care in the ED.  As part of my medical decision making, I reviewed the following data within the electronic MEDICAL RECORD NUMBER Nursing notes reviewed and incorporated, Labs reviewed, notes from prior ED visits and [place] Controlled Substance Database   ____________________________________________   FINAL CLINICAL IMPRESSION(S) / ED DIAGNOSES  Final diagnoses:  Non-intractable vomiting with nausea, unspecified vomiting type      NEW MEDICATIONS STARTED DURING THIS VISIT:  New Prescriptions   ONDANSETRON (ZOFRAN) 4 MG TABLET    Take 1 tablet (4 mg total) by mouth daily as needed.     Note:  This document was prepared using Dragon voice recognition software and may include unintentional dictation errors.    Willy Eddy, MD 03/08/21 1020

## 2021-03-08 NOTE — ED Triage Notes (Signed)
Pt states she has had a headache, lower back pain and started vomiting yesterday. Pt denies any fevers at home, denies any burning with urination. Pt denies any light sensitivity or numbness with headache.

## 2021-03-08 NOTE — ED Notes (Signed)
Pt resting with her eyes closed, easily awakened with name calling, Pt offered something to drink and states she doesn't want anything right now

## 2021-04-17 ENCOUNTER — Emergency Department
Admission: EM | Admit: 2021-04-17 | Discharge: 2021-04-17 | Disposition: A | Discharge status: Home / Self Care | Payer: 59 | Attending: Emergency Medicine | Admitting: Emergency Medicine

## 2021-04-17 ENCOUNTER — Emergency Department: Payer: 59

## 2021-04-17 ENCOUNTER — Other Ambulatory Visit: Payer: Self-pay

## 2021-04-17 DIAGNOSIS — M542 Cervicalgia: Secondary | ICD-10-CM | POA: Insufficient documentation

## 2021-04-17 DIAGNOSIS — R221 Localized swelling, mass and lump, neck: Secondary | ICD-10-CM | POA: Insufficient documentation

## 2021-04-17 DIAGNOSIS — M545 Low back pain, unspecified: Secondary | ICD-10-CM | POA: Insufficient documentation

## 2021-04-17 DIAGNOSIS — F17210 Nicotine dependence, cigarettes, uncomplicated: Secondary | ICD-10-CM | POA: Insufficient documentation

## 2021-04-17 LAB — URINALYSIS, COMPLETE (UACMP) WITH MICROSCOPIC
Bacteria, UA: NONE SEEN
Bilirubin Urine: NEGATIVE
Glucose, UA: NEGATIVE mg/dL
Ketones, ur: NEGATIVE mg/dL
Leukocytes,Ua: NEGATIVE
Nitrite: NEGATIVE
Protein, ur: 30 mg/dL — AB
RBC / HPF: >50 RBC/hpf — ABNORMAL HIGH (ref 0–5)
Specific Gravity, Urine: 1.017 (ref 1.005–1.030)
pH: 9 — ABNORMAL HIGH (ref 5.0–8.0)

## 2021-04-17 MED ORDER — KETOROLAC TROMETHAMINE 60 MG/2ML IM SOLN
60.0000 mg | Freq: Once | INTRAMUSCULAR | Status: AC
  Administered 2021-04-17: 60 mg via INTRAMUSCULAR
  Filled 2021-04-17: qty 2

## 2021-04-17 MED ORDER — BACLOFEN 10 MG PO TABS: 10.0000 mg | ORAL_TABLET | Freq: Three times a day (TID) | ORAL | 0 refills | Status: AC

## 2021-04-17 MED ORDER — MELOXICAM 15 MG PO TABS: 15.0000 mg | ORAL_TABLET | Freq: Every day | ORAL | 2 refills | Status: DC

## 2021-04-17 NOTE — Discharge Instructions (Addendum)
Follow-up with your regular doctor if not improving in 2 to 3 days.  Return emergency department worsening.  Take medications as prescribed.  Apply warm compress to the lower back.

## 2021-04-17 NOTE — ED Notes (Signed)
See triage note  Presents with lower back pain for several days  Denies any injury or urinary sx's  States pain does move into right hip area  Ambulates well to treatment room

## 2021-04-17 NOTE — ED Provider Notes (Signed)
[facility] Emergency Department Provider Note  ____________________________________________   Event Date/Time   First MD Initiated Contact with Patient 04/17/21 0818     (approximate)  I have reviewed the triage vital signs and the nursing notes.   HISTORY  Chief Complaint Back Pain    HPI Alexis Francis is a 30 y.o. female presents emergency department complaining of lower back pain for several days.  No dysuria or hematuria.  Patient states the pain radiates into the hip.  She is also concerned about a lump on the back of her upper neck.  States its become more swollen.  No fever or chills.  No numbness or tingling    Past Medical History:  Diagnosis Date  . Ovarian cyst     Patient Active Problem List   Diagnosis Date Noted  . PID (acute pelvic inflammatory disease) 05/02/2017    No past surgical history on file.  Prior to Admission medications   Medication Sig Start Date End Date Taking? Authorizing Provider  baclofen (LIORESAL) 10 MG tablet Take 1 tablet (10 mg total) by mouth 3 (three) times daily for 7 days. 04/17/21 04/24/21 Yes Maylynn Orzechowski, Roselyn Bering, PA-C  meloxicam (MOBIC) 15 MG tablet Take 1 tablet (15 mg total) by mouth daily. 04/17/21 04/17/22 Yes Minetta Krisher, Roselyn Bering, PA-C    Allergies Patient has no known allergies.  No family history on file.  Social History Social History   Tobacco Use  . Smoking status: Current Every Day Smoker    Packs/day: 0.50    Types: Cigarettes  . Smokeless tobacco: Never Used  Vaping Use  . Vaping Use: Never used  Substance Use Topics  . Alcohol use: No  . Drug use: Yes    Types: Marijuana    Review of Systems  Constitutional: No fever/chills Eyes: No visual changes. ENT: No sore throat. Respiratory: Denies cough Cardiovascular: Denies chest pain Gastrointestinal: Denies abdominal pain Genitourinary: Negative for dysuria. Musculoskeletal: Positive for back pain. Skin: Negative for  rash. Psychiatric: no mood changes,     ____________________________________________   PHYSICAL EXAM:  VITAL SIGNS: ED Triage Vitals  Enc Vitals Group     BP 04/17/21 0806 (!) 138/58     Pulse Rate 04/17/21 0806 94     Resp 04/17/21 0806 14     Temp 04/17/21 0806 98.8 F (37.1 C)     Temp Source 04/17/21 0806 Oral     SpO2 04/17/21 0806 100 %     Weight 04/17/21 0808 280 lb (127 kg)     Height 04/17/21 0807 5\' 5"  (1.651 m)     Head Circumference --      Peak Flow --      Pain Score 04/17/21 0807 5     Pain Loc --      Pain Edu? --      Excl. in GC? --     Constitutional: Alert and oriented. Well appearing and in no acute distress. Eyes: Conjunctivae are normal.  Head: Atraumatic. Nose: No congestion/rhinnorhea. Mouth/Throat: Mucous membranes are moist.   Neck:  supple no lymphadenopathy noted Cardiovascular: Normal rate, regular rhythm. Heart sounds are normal Respiratory: Normal respiratory effort.  No retractions, lungs c t a  Abd: soft nontender bs normal all 4 quad, no CVA tenderness GU: deferred Musculoskeletal: FROM all extremities, warm and well perfused, lump noted at approximately C5-C6 or C7, area is tender to palpation, lumbar spine is not tender, patient is able to stand and walk to  the bathroom without difficulty Neurologic:  Normal speech and language.  Skin:  Skin is warm, dry and intact. No rash noted. Psychiatric: Mood and affect are normal. Speech and behavior are normal.  ____________________________________________   LABS (all labs ordered are listed, but only abnormal results are displayed)  Labs Reviewed  URINALYSIS, COMPLETE (UACMP) WITH MICROSCOPIC - Abnormal; Notable for the following components:      Result Value   Color, Urine AMBER (*)    APPearance CLOUDY (*)    pH 9.0 (*)    Hgb urine dipstick LARGE (*)    Protein, ur 30 (*)    RBC / HPF >50 (*)    All other components within normal limits  POC URINE PREG, ED    ____________________________________________   ____________________________________________  RADIOLOGY  X-rays C-spine  ____________________________________________   PROCEDURES  Procedure(s) performed: No  Procedures    ____________________________________________   INITIAL IMPRESSION / ASSESSMENT AND PLAN / ED COURSE  Pertinent labs & imaging results that were available during my care of the patient were reviewed by me and considered in my medical decision making (see chart for details).   Patient's 30 year old female presents with neck and back pain.  See HPI.  Physical exam shows patient appears stable  X-ray of the C-spine, Toradol 60 mg IM   X-rays C-spine reviewed by me confirmed by radiology to be normal, soft tissue swelling noted posteriorly  Patient states she feels better with the Toradol.  Still awaiting the UA.  UA shows hematuria but patient is currently on her menstrual cycle.  No infection is noted.  She will be discharged with a prescription for meloxicam and baclofen.  Return emergency department worsening.  Follow-up with orthopedics if not better in 1 week.  She is discharged stable condition.  Alexis Francis was evaluated in Emergency Department on 04/17/2021 for the symptoms described in the history of present illness. She was evaluated in the context of the global COVID-19 pandemic, which necessitated consideration that the patient might be at risk for infection with the SARS-CoV-2 virus that causes COVID-19. Institutional protocols and algorithms that pertain to the evaluation of patients at risk for COVID-19 are in a state of rapid change based on information released by regulatory bodies including the CDC and federal and state organizations. These policies and algorithms were followed during the patient's care in the ED.    As part of my medical decision making, I reviewed the following data within the electronic MEDICAL RECORD NUMBER Nursing notes  reviewed and incorporated, Labs reviewed , Old chart reviewed, Radiograph reviewed , Notes from prior ED visits and [place] Controlled Substance Database  ____________________________________________   FINAL CLINICAL IMPRESSION(S) / ED DIAGNOSES  Final diagnoses:  Acute midline low back pain without sciatica  Neck pain      NEW MEDICATIONS STARTED DURING THIS VISIT:  New Prescriptions   BACLOFEN (LIORESAL) 10 MG TABLET    Take 1 tablet (10 mg total) by mouth 3 (three) times daily for 7 days.   MELOXICAM (MOBIC) 15 MG TABLET    Take 1 tablet (15 mg total) by mouth daily.     Note:  This document was prepared using Dragon voice recognition software and may include unintentional dictation errors.    Faythe Ghee, PA-C 04/17/21 1054    Merwyn Katos, MD 04/17/21 [phone]

## 2021-04-17 NOTE — ED Triage Notes (Signed)
Patient in with lower back pain x 1 week.

## 2021-06-07 ENCOUNTER — Emergency Department
Admission: EM | Admit: 2021-06-07 | Discharge: 2021-06-07 | Disposition: A | Discharge status: Home / Self Care | Payer: 59 | Attending: Emergency Medicine | Admitting: Emergency Medicine

## 2021-06-07 ENCOUNTER — Other Ambulatory Visit: Payer: Self-pay

## 2021-06-07 ENCOUNTER — Encounter: Payer: Self-pay | Admitting: *Deleted

## 2021-06-07 DIAGNOSIS — M25561 Pain in right knee: Secondary | ICD-10-CM | POA: Diagnosis present

## 2021-06-07 DIAGNOSIS — F17210 Nicotine dependence, cigarettes, uncomplicated: Secondary | ICD-10-CM | POA: Diagnosis not present

## 2021-06-07 DIAGNOSIS — M1711 Unilateral primary osteoarthritis, right knee: Secondary | ICD-10-CM | POA: Insufficient documentation

## 2021-06-07 DIAGNOSIS — G8929 Other chronic pain: Secondary | ICD-10-CM

## 2021-06-07 MED ORDER — MELOXICAM 15 MG PO TABS: 15.0000 mg | ORAL_TABLET | Freq: Every day | ORAL | 0 refills | Status: DC

## 2021-06-07 MED ORDER — KETOROLAC TROMETHAMINE 30 MG/ML IJ SOLN
30.0000 mg | Freq: Once | INTRAMUSCULAR | Status: AC
  Administered 2021-06-07: 30 mg via INTRAMUSCULAR
  Filled 2021-06-07: qty 1

## 2021-06-07 NOTE — Discharge Instructions (Addendum)
Please rest ice and elevate the right knee.  Avoid weightbearing activity until you can ambulate without a limp.  Take meloxicam daily with food.  You may use Tylenol for additional pain relief.  Call orthopedic office tomorrow to schedule follow-up appointment.

## 2021-06-07 NOTE — ED Provider Notes (Signed)
[facility] EMERGENCY DEPARTMENT Provider Note   CSN: [id number] Arrival date & time: 06/07/21  1818     History Chief Complaint  Patient presents with   Knee Pain    Alexis Francis is a 30 y.o. female presents to the emergency department evaluation of right knee pain.  She has had on and off knee pain for greater than 1 year.  X-rays 1 year ago showed a mild joint space narrowing in the medial compartment.  Over the last few weeks she has been having increasing pain along the medial and lateral joint line of the right knee with occasional swelling but no catching clicking locking.  Her knee feels as if it may want to give way at times.  She denies any trauma or injury.  No warmth redness or fevers.  No pain in the thigh or in the calf.  No warmth redness or swelling throughout the lower leg.  No history of blood clots.  She has been taking Tylenol with no relief.  She has normal kidney function and tolerates NSAIDs well.  She has not seen an orthopedist.  HPI     Past Medical History:  Diagnosis Date   Ovarian cyst     Patient Active Problem List   Diagnosis Date Noted   PID (acute pelvic inflammatory disease) 05/02/2017    No past surgical history on file.   OB History     Gravida  0   Para  0   Term  0   Preterm  0   AB  0   Living  0      SAB  0   IAB  0   Ectopic  0   Multiple  0   Live Births  0           No family history on file.  Social History   Tobacco Use   Smoking status: Every Day    Packs/day: 0.50    Pack years: 0.00    Types: Cigarettes   Smokeless tobacco: Never  Vaping Use   Vaping Use: Never used  Substance Use Topics   Alcohol use: No   Drug use: Yes    Types: Marijuana    Home Medications Prior to Admission medications   Medication Sig Start Date End Date Taking? Authorizing Provider  meloxicam (MOBIC) 15 MG tablet Take 1 tablet (15 mg total) by mouth daily. 06/07/21 06/07/22 Yes Evon Slack, PA-C    Allergies    Patient has no known allergies.  Review of Systems   Review of Systems  Constitutional:  Negative for fever.  Respiratory:  Negative for shortness of breath.   Cardiovascular:  Negative for chest pain and leg swelling.  Gastrointestinal:  Negative for abdominal pain.  Genitourinary:  Negative for difficulty urinating, dysuria and urgency.  Musculoskeletal:  Positive for arthralgias. Negative for back pain, gait problem, joint swelling and myalgias.  Skin:  Negative for rash and wound.  Neurological:  Negative for dizziness, numbness and headaches.   Physical Exam Updated Vital Signs BP 111/74 (BP Location: Left Arm)   Pulse 100   Temp 99.2 F (37.3 C) (Oral)   Resp 18   Ht 5\' 5"  (1.651 m)   Wt 127 kg   LMP 05/22/2021 (Approximate)   SpO2 99%   BMI 46.59 kg/m   Physical Exam Constitutional:      Appearance: She is well-developed.  HENT:     Head: Normocephalic and atraumatic.  Nose: Nose normal.     Mouth/Throat:     Pharynx: No oropharyngeal exudate or posterior oropharyngeal erythema.  Eyes:     Conjunctiva/sclera: Conjunctivae normal.  Cardiovascular:     Rate and Rhythm: Normal rate.  Pulmonary:     Effort: Pulmonary effort is normal. No respiratory distress.  Musculoskeletal:        General: Normal range of motion.     Cervical back: Normal range of motion.     Comments: Right lower extremity with no swelling warmth erythema or edema.  She is nontender palpation throughout the tib-fib region.  She does have some tenderness along the right knee medial joint line that is worse with varus stress testing with no laxity.  She is able to straight leg raise.  Patella tracks well in the trochlear groove.  She has no lateral knee pain and no posterior knee discomfort or swelling.  She has 0 to 110 degrees range of motion.  Her hips move well with internal X rotation with no discomfort.    Skin:    General: Skin is warm.     Findings: No  rash.  Neurological:     Mental Status: She is alert and oriented to person, place, and time.  Psychiatric:        Behavior: Behavior normal.        Thought Content: Thought content normal.    ED Results / Procedures / Treatments   Labs (all labs ordered are listed, but only abnormal results are displayed) Labs Reviewed - No data to display  EKG None  Radiology No results found. Nonweightbearing x-rays of the right knee reviewed by me from March 2021 show mild joint space narrowing in the medial compartment no evidence of acute bony abnormality.  Procedures Procedures   Medications Ordered in ED Medications  ketorolac (TORADOL) 30 MG/ML injection 30 mg (has no administration in time range)    ED Course  I have reviewed the triage vital signs and the nursing notes.  Pertinent labs & imaging results that were available during my care of the patient were reviewed by me and considered in my medical decision making (see chart for details).    MDM Rules/Calculators/A&P                          30 year old female with acute on chronic right knee pain.  Vital signs are stable, afebrile.  No sign of any infectious process.  No swelling warmth erythema or edema throughout the right leg.  No signs of blood clots.  Her history and exam is consistent with knee arthritis.  She has had chronic knee pain without acute flareup.  X-rays from 1 year ago show mild joint space narrowing in the medial compartment.  She will continue with Tylenol, start meloxicam.  She will call orthopedics to schedule follow-up appointment.  We discussed rest ice elevation.  She is given a note for work for 2 days.  She understands signs symptoms return to the ER for. Final Clinical Impression(s) / ED Diagnoses Final diagnoses:  Chronic pain of right knee  Primary osteoarthritis of right knee    Rx / DC Orders ED Discharge Orders          Ordered    meloxicam (MOBIC) 15 MG tablet  Daily        06/07/21  2016             Evon Slack, [place] 06/07/21 2023  Delton Prairie, MD 06/07/21 [phone]

## 2021-06-07 NOTE — ED Triage Notes (Signed)
Pt has right knee pain.  No known injury.  Sx for 1 week.   Pt alert  speech clear.

## 2021-08-18 ENCOUNTER — Emergency Department
Admission: EM | Admit: 2021-08-18 | Discharge: 2021-08-18 | Disposition: A | Discharge status: Home / Self Care | Payer: 59 | Attending: Emergency Medicine | Admitting: Emergency Medicine

## 2021-08-18 ENCOUNTER — Other Ambulatory Visit: Payer: Self-pay

## 2021-08-18 DIAGNOSIS — S39012A Strain of muscle, fascia and tendon of lower back, initial encounter: Secondary | ICD-10-CM | POA: Diagnosis not present

## 2021-08-18 DIAGNOSIS — S3992XA Unspecified injury of lower back, initial encounter: Secondary | ICD-10-CM | POA: Diagnosis present

## 2021-08-18 DIAGNOSIS — X501XXA Overexertion from prolonged static or awkward postures, initial encounter: Secondary | ICD-10-CM | POA: Diagnosis not present

## 2021-08-18 DIAGNOSIS — F17210 Nicotine dependence, cigarettes, uncomplicated: Secondary | ICD-10-CM | POA: Insufficient documentation

## 2021-08-18 DIAGNOSIS — Y990 Civilian activity done for income or pay: Secondary | ICD-10-CM | POA: Diagnosis not present

## 2021-08-18 MED ORDER — MELOXICAM 15 MG PO TABS: 15.0000 mg | ORAL_TABLET | Freq: Every day | ORAL | 0 refills | Status: DC

## 2021-08-18 MED ORDER — BACLOFEN 10 MG PO TABS: 10.0000 mg | ORAL_TABLET | Freq: Three times a day (TID) | ORAL | 0 refills | Status: AC

## 2021-08-18 NOTE — ED Provider Notes (Signed)
[facility] Emergency Department Provider Note  ____________________________________________   Event Date/Time   First MD Initiated Contact with Patient 08/18/21 [phone]     (approximate)  I have reviewed the triage vital signs and the nursing notes.   HISTORY  Chief Complaint Back Pain    HPI Alexis Francis is a 30 y.o. female  C/o low back pain for 2 day, no known injury, pain is worse with movement, increased with bending over, denies numbness, tingling, or changes in bowel/urinary habits,  Using otc meds without relief.  Patient states she does a lot of bending and lifting at work.  History the same symptoms in which she was given a muscle relaxer.  States she had relief with those medications Remainder ros neg   Past Medical History:  Diagnosis Date   Ovarian cyst     Patient Active Problem List   Diagnosis Date Noted   PID (acute pelvic inflammatory disease) 05/02/2017    History reviewed. No pertinent surgical history.  Prior to Admission medications   Medication Sig Start Date End Date Taking? Authorizing Provider  baclofen (LIORESAL) 10 MG tablet Take 1 tablet (10 mg total) by mouth 3 (three) times daily for 7 days. 08/18/21 08/25/21 Yes Bram Hottel, Roselyn Bering, PA-C  meloxicam (MOBIC) 15 MG tablet Take 1 tablet (15 mg total) by mouth daily. 08/18/21 08/18/22  Faythe Ghee, PA-C    Allergies Patient has no known allergies.  No family history on file.  Social History Social History   Tobacco Use   Smoking status: Every Day    Packs/day: 0.50    Types: Cigarettes   Smokeless tobacco: Never  Vaping Use   Vaping Use: Never used  Substance Use Topics   Alcohol use: No   Drug use: Yes    Types: Marijuana    Review of Systems  Constitutional: No fever/chills Eyes: No visual changes. ENT: No sore throat. Respiratory: Denies cough Genitourinary: Negative for dysuria. Musculoskeletal: Positive for back pain. Skin: Negative for  rash.    ____________________________________________   PHYSICAL EXAM:  VITAL SIGNS: ED Triage Vitals  Enc Vitals Group     BP 08/18/21 0839 123/73     Pulse Rate 08/18/21 0839 75     Resp 08/18/21 0839 18     Temp 08/18/21 0839 98.2 F (36.8 C)     Temp Source 08/18/21 0839 Oral     SpO2 08/18/21 0839 97 %     Weight 08/18/21 0836 280 lb (127 kg)     Height 08/18/21 0836 5\' 5"  (1.651 m)     Head Circumference --      Peak Flow --      Pain Score 08/18/21 0836 5     Pain Loc --      Pain Edu? --      Excl. in GC? --     Constitutional: Alert and oriented. Well appearing and in no acute distress. Eyes: Conjunctivae are normal.  Head: Atraumatic. Nose: No congestion/rhinnorhea. Mouth/Throat: Mucous membranes are moist.   Neck:  supple no lymphadenopathy noted Cardiovascular: Normal rate, regular rhythm. Heart sounds are normal Respiratory: Normal respiratory effort.  No retractions, lungs c t a  Abd: soft nontender bs normal all 4 quad GU: deferred Musculoskeletal: FROM all extremities, warm and well perfused.  Decreased rom of back due to discomfort, lumbar spine nontender, negative slr, 5/5 strength in great toes b/l, 5/5 strength in lower legs, n/v intact Neurologic:  Normal speech and  language.  Skin:  Skin is warm, dry and intact. No rash noted. Psychiatric: Mood and affect are normal. Speech and behavior are normal.  ____________________________________________   LABS (all labs ordered are listed, but only abnormal results are displayed)  Labs Reviewed - No data to display ____________________________________________   ____________________________________________  RADIOLOGY    ____________________________________________   PROCEDURES  Procedure(s) performed: No  Procedures    ____________________________________________   INITIAL IMPRESSION / ASSESSMENT AND PLAN / ED COURSE  Pertinent labs & imaging results that were available during my  care of the patient were reviewed by me and considered in my medical decision making (see chart for details).   Patient is 30 year old female presents emergency department with low back pain which is consistent with a lumbar strain.  See HPI physical exam shows she is very stable.  We had a discussion of the lumbar strain.  Patient drove herself here so we will not give her pain medications here in the ED.  She was given a prescription for meloxicam and baclofen.  She is to apply ice.  We did discuss strengthening the core muscles.  She was given back exercises.  She is in agreement treatment plan.  Discharged in stable condition.     As part of my medical decision making, I reviewed the following data within the electronic MEDICAL RECORD NUMBER Nursing notes reviewed and incorporated, Old chart reviewed, Notes from prior ED visits, and [place] Controlled Substance Database  ____________________________________________   FINAL CLINICAL IMPRESSION(S) / ED DIAGNOSES  Final diagnoses:  Strain of lumbar region, initial encounter      NEW MEDICATIONS STARTED DURING THIS VISIT:  Discharge Medication List as of 08/18/2021  9:03 AM     START taking these medications   Details  baclofen (LIORESAL) 10 MG tablet Take 1 tablet (10 mg total) by mouth 3 (three) times daily for 7 days., Starting Fri 08/18/2021, Until Fri 08/25/2021, Normal         Note:  This document was prepared using Dragon voice recognition software and may include unintentional dictation errors.     Faythe Ghee, PA-C 08/18/21 1143    Jene Every, MD 08/18/21 [phone]

## 2021-08-18 NOTE — ED Triage Notes (Signed)
Pt c/o lower back pain since Monday, denies injury, but states she does a lot of bending over at work

## 2021-08-18 NOTE — Discharge Instructions (Signed)
Follow-up with your regular doctor if not improving in 2 to 3 days.  Return emergency department worsening.  Take medication as prescribed.  Apply ice to the lower back.  You may also want to try over-the-counter Lidoderm patches

## 2022-02-21 ENCOUNTER — Other Ambulatory Visit: Payer: Self-pay

## 2022-02-21 ENCOUNTER — Emergency Department
Admission: EM | Admit: 2022-02-21 | Discharge: 2022-02-21 | Disposition: A | Discharge status: Home / Self Care | Payer: 59 | Attending: Emergency Medicine | Admitting: Emergency Medicine

## 2022-02-21 DIAGNOSIS — M545 Low back pain, unspecified: Secondary | ICD-10-CM | POA: Diagnosis not present

## 2022-02-21 DIAGNOSIS — Y990 Civilian activity done for income or pay: Secondary | ICD-10-CM | POA: Diagnosis not present

## 2022-02-21 DIAGNOSIS — X500XXA Overexertion from strenuous movement or load, initial encounter: Secondary | ICD-10-CM | POA: Insufficient documentation

## 2022-02-21 LAB — POC URINE PREG, ED: Preg Test, Ur: NEGATIVE

## 2022-02-21 MED ORDER — METHOCARBAMOL 500 MG PO TABS: 500.0000 mg | ORAL_TABLET | Freq: Four times a day (QID) | ORAL | 0 refills | Status: DC

## 2022-02-21 MED ORDER — KETOROLAC TROMETHAMINE 30 MG/ML IJ SOLN
30.0000 mg | Freq: Once | INTRAMUSCULAR | Status: AC
  Administered 2022-02-21: 30 mg via INTRAMUSCULAR
  Filled 2022-02-21: qty 1

## 2022-02-21 MED ORDER — NAPROXEN 500 MG PO TABS: 500.0000 mg | ORAL_TABLET | Freq: Two times a day (BID) | ORAL | 0 refills | Status: DC

## 2022-02-21 NOTE — Discharge Instructions (Signed)
Call make an appointment with your primary care provider or Dr. Rosita Kea who is on-call for orthopedics if you continue to have problems with your back.  You may use ice or heat to your back as needed for discomfort.  A prescription for methocarbamol which is a muscle relaxant was sent to the pharmacy to take every 6 hours if needed for muscle spasms.  Naproxen is 500 mg twice daily with food.  You may take Tylenol with this medication if additional pain medication is needed. ?

## 2022-02-21 NOTE — ED Triage Notes (Signed)
Pt c/o lower back pain for the past couple of days, denies injuries. ?

## 2022-02-21 NOTE — ED Notes (Signed)
ED Provider at bedside. 

## 2022-02-21 NOTE — ED Provider Notes (Signed)
? ?[facility] ?Provider Note ? ? ? None  ?  (approximate) ? ? ?History  ? ?Back Pain ? ? ?HPI ? ?Alexis Francis is a 31 y.o. female   presents to the ED with complaint of low back pain.  Patient states that she does a lot of lifting while at work and began hurting 2 to 3 days ago.  She is not taking any over-the-counter medication for this.  She has used some patches to her back with minimal relief.  She was seen last year for the same symptoms and states this is very similar to this.  She denies any incontinence of bowel or bladder.  She denies any urinary symptoms.  She rates her pain as 5 out of 10. ? ?  ? ? ?Physical Exam  ? ?Triage Vital Signs: ?ED Triage Vitals  ?Enc Vitals Group  ?   BP 02/21/22 0740 120/68  ?   Pulse Rate 02/21/22 0740 82  ?   Resp 02/21/22 0740 17  ?   Temp 02/21/22 0740 98.1 ?F (36.7 ?C)  ?   Temp Source 02/21/22 0740 Oral  ?   SpO2 02/21/22 0740 99 %  ?   Weight --   ?   Height --   ?   Head Circumference --   ?   Peak Flow --   ?   Pain Score 02/21/22 0732 5  ?   Pain Loc --   ?   Pain Edu? --   ?   Excl. in GC? --   ? ? ?Most recent vital signs: ?Vitals:  ? 02/21/22 0740  ?BP: 120/68  ?Pulse: 82  ?Resp: 17  ?Temp: 98.1 ?F (36.7 ?C)  ?SpO2: 99%  ? ? ? ?General: Awake, no distress.  Ambulatory without any assistance. ?CV:  Good peripheral perfusion.  Heart regular rate and rhythm. ?Resp:  Normal effort.  Lungs are clear bilaterally. ?Abd:  No distention.  ?Other:  On examination of the back there is no gross deformity however there is tenderness L5-S1 and bilateral sacral areas to palpation.  No point tenderness on palpation of the thoracic or upper lumbar spine vertebral bodies.  Patient is able to stand and ambulate without any assistance.  Good muscle strength bilaterally at 5/5.  Reflexes bilaterally are 1+ lower extremities.  No edema noted lower extremities. ? ? ?ED Results / Procedures / Treatments  ? ?Labs ?(all labs ordered are listed, but only abnormal  results are displayed) ?Labs Reviewed  ?POC URINE PREG, ED  ? ? ? ? ?PROCEDURES: ? ?Critical Care performed:  ? ?Procedures ? ? ?MEDICATIONS ORDERED IN ED: ?Medications  ?ketorolac (TORADOL) 30 MG/ML injection 30 mg (30 mg Intramuscular Given 02/21/22 0901)  ? ? ? ?IMPRESSION / MDM / ASSESSMENT AND PLAN / ED COURSE  ?I reviewed the triage vital signs and the nursing notes. ? ? ?Differential diagnosis includes, but is not limited to, lumbar strain, low back pain. ? ?31 year old female presents to the ED with complaint of low back pain with out known injury.  Patient states that she does a lot of lifting at work and was here last year for similar issues.  Patient has been using the pain patches but has not taken any over-the-counter medication.  Physical exam is positive for moderate muscle tenderness in the L5-S1 and paravertebral muscle area.  Range of motion is slow and guarded but patient is able to ambulate without any assistance.  Patient was given Toradol  30 mg IM while in the ED after pregnancy test was negative.  Urine was clear and not sent to the lab for testing.  Patient was told she may continue using her over-the-counter patches.  Patient was discharged with a prescription for naproxen 500 mg twice daily and methocarbamol 500 mg every 6 hours if needed for muscle spasms.  She is encouraged to use ice or heat and to follow-up with her primary care provider or Dr. Pablo Ledger if any continued problems with her back. ? ? ? ?  ? ? ?FINAL CLINICAL IMPRESSION(S) / ED DIAGNOSES  ? ?Final diagnoses:  ?Acute bilateral low back pain without sciatica  ? ? ? ?Rx / DC Orders  ? ?ED Discharge Orders   ? ?      Ordered  ?  naproxen (NAPROSYN) 500 MG tablet  2 times daily with meals       ? 02/21/22 0926  ?  methocarbamol (ROBAXIN) 500 MG tablet  4 times daily       ? 02/21/22 0926  ? ?  ?  ? ?  ? ? ? ?Note:  This document was prepared using Dragon voice recognition software and may include unintentional dictation errors. ?   ?Tommi Rumps, PA-C ?02/21/22 1147 ? ?  ?Arnaldo Natal, MD ?02/21/22 1557 ? ?

## 2022-10-26 ENCOUNTER — Emergency Department
Admission: EM | Admit: 2022-10-26 | Discharge: 2022-10-26 | Disposition: A | Discharge status: Home / Self Care | Payer: 59 | Attending: Emergency Medicine | Admitting: Emergency Medicine

## 2022-10-26 ENCOUNTER — Other Ambulatory Visit: Payer: Self-pay

## 2022-10-26 ENCOUNTER — Emergency Department: Payer: 59

## 2022-10-26 ENCOUNTER — Encounter: Payer: Self-pay | Admitting: Emergency Medicine

## 2022-10-26 DIAGNOSIS — M545 Low back pain, unspecified: Secondary | ICD-10-CM | POA: Insufficient documentation

## 2022-10-26 DIAGNOSIS — X503XXA Overexertion from repetitive movements, initial encounter: Secondary | ICD-10-CM | POA: Insufficient documentation

## 2022-10-26 DIAGNOSIS — M533 Sacrococcygeal disorders, not elsewhere classified: Secondary | ICD-10-CM | POA: Insufficient documentation

## 2022-10-26 DIAGNOSIS — Y990 Civilian activity done for income or pay: Secondary | ICD-10-CM | POA: Diagnosis not present

## 2022-10-26 DIAGNOSIS — R350 Frequency of micturition: Secondary | ICD-10-CM | POA: Insufficient documentation

## 2022-10-26 LAB — CBC
HCT: 36.6 % (ref 36.0–46.0)
Hemoglobin: 12 g/dL (ref 12.0–15.0)
MCH: 30.9 pg (ref 26.0–34.0)
MCHC: 32.8 g/dL (ref 30.0–36.0)
MCV: 94.3 fL (ref 80.0–100.0)
Platelets: 330 10*3/uL (ref 150–400)
RBC: 3.88 MIL/uL (ref 3.87–5.11)
RDW: 12.6 % (ref 11.5–15.5)
WBC: 4.2 10*3/uL (ref 4.0–10.5)
nRBC: 0 % (ref 0.0–0.2)

## 2022-10-26 LAB — URINALYSIS, COMPLETE (UACMP) WITH MICROSCOPIC
Bilirubin Urine: NEGATIVE
Glucose, UA: NEGATIVE mg/dL
Ketones, ur: NEGATIVE mg/dL
Leukocytes,Ua: NEGATIVE
Nitrite: NEGATIVE
Protein, ur: NEGATIVE mg/dL
Specific Gravity, Urine: 1.023 (ref 1.005–1.030)
pH: 5 (ref 5.0–8.0)

## 2022-10-26 LAB — BASIC METABOLIC PANEL
Anion gap: 6 (ref 5–15)
BUN: 10 mg/dL (ref 6–20)
CO2: 24 mmol/L (ref 22–32)
Calcium: 8.8 mg/dL — ABNORMAL LOW (ref 8.9–10.3)
Chloride: 112 mmol/L — ABNORMAL HIGH (ref 98–111)
Creatinine, Ser: 1.08 mg/dL — ABNORMAL HIGH (ref 0.44–1.00)
GFR, Estimated: >60 mL/min (ref >60)
Glucose, Bld: 99 mg/dL (ref 70–99)
Potassium: 4 mmol/L (ref 3.5–5.1)
Sodium: 142 mmol/L (ref 135–145)

## 2022-10-26 LAB — POC URINE PREG, ED: Preg Test, Ur: NEGATIVE

## 2022-10-26 MED ORDER — IBUPROFEN 600 MG PO TABS: ORAL_TABLET | ORAL | 0 refills | Status: AC

## 2022-10-26 MED ORDER — METHOCARBAMOL 500 MG PO TABS: 500.0000 mg | ORAL_TABLET | Freq: Four times a day (QID) | ORAL | 0 refills | Status: AC | PRN

## 2022-10-26 MED ORDER — KETOROLAC TROMETHAMINE 30 MG/ML IJ SOLN
30.0000 mg | Freq: Once | INTRAMUSCULAR | Status: AC
  Administered 2022-10-26: 30 mg via INTRAMUSCULAR
  Filled 2022-10-26: qty 1

## 2022-10-26 NOTE — ED Triage Notes (Signed)
Pt to ED from home c/o lower bilateral back pain since Sunday.  States urinary frequency.  Denies n/v/d, fevers, or hematuria.

## 2022-10-26 NOTE — ED Notes (Signed)
See triage note  Presents with lower back pain for a few days  Denies nay urinary sx's or injury  Ambulates well to treatment room

## 2022-10-26 NOTE — Discharge Instructions (Signed)
Call make appoint with Dr. Mack Guise if you continue having problems with your back.  2 prescriptions were sent to your pharmacy 1 is ibuprofen 600 mg to be taken with food every 6 hours and methocarbamol which is a muscle relaxant to be taken every 6 hours.  Do not drive or operate machinery while taking the muscle relaxant methocarbamol as it could cause drowsiness and increase your risk for injury.  Ice or heat to your back as needed for discomfort.

## 2022-10-26 NOTE — ED Provider Notes (Signed)
[facility] Provider Note    Event Date/Time   First MD Initiated Contact with Patient 10/26/22 [phone]     (approximate)   History   Back Pain   HPI  Alexis Francis is a 31 y.o. female   to the ED with complaint of bilateral low back pain for the last 4 to 5 days.  Patient is unaware of any known injury but states that she does a lot of lifting and pushing every day at work.  Some urinary frequency.  Denies nausea, vomiting, diarrhea, fever, dysuria or hematuria.  Patient has been taking Tylenol without any relief.      Physical Exam   Triage Vital Signs: ED Triage Vitals  Enc Vitals Group     BP 10/26/22 0624 126/80     Pulse Rate 10/26/22 0624 94     Resp 10/26/22 0624 18     Temp 10/26/22 0624 98.5 F (36.9 C)     Temp Source 10/26/22 0624 Oral     SpO2 10/26/22 0624 97 %     Weight 10/26/22 0624 280 lb (127 kg)     Height 10/26/22 0624 5\' 5"  (1.651 m)     Head Circumference --      Peak Flow --      Pain Score 10/26/22 0637 5     Pain Loc --      Pain Edu? --      Excl. in [place]? --     Most recent vital signs: Vitals:   10/26/22 0624 10/26/22 0917  BP: 126/80 120/78  Pulse: 94 88  Resp: 18 18  Temp: 98.5 F (36.9 C)   SpO2: 97% 98%     General: Awake, no distress.  CV:  Good peripheral perfusion.  Resp:  Normal effort.  Abd:  No distention.  Other:  Lamination of the back there is no gross deformity however there is tenderness on palpation midline lower lumbar and sacral area and paravertebral muscles bilaterally.  Straight leg raises at 40 degrees bilaterally and negative, good muscle strength at 5/5 bilaterally.  Patient is able to stand and ambulate without any assistance.   ED Results / Procedures / Treatments   Labs (all labs ordered are listed, but only abnormal results are displayed) Labs Reviewed  BASIC METABOLIC PANEL - Abnormal; Notable for the following components:      Result Value   Chloride 112 (*)     Creatinine, Ser 1.08 (*)    Calcium 8.8 (*)    All other components within normal limits  URINALYSIS, COMPLETE (UACMP) WITH MICROSCOPIC - Abnormal; Notable for the following components:   Color, Urine YELLOW (*)    APPearance HAZY (*)    Hgb urine dipstick SMALL (*)    Bacteria, UA RARE (*)    All other components within normal limits  CBC  POC URINE PREG, ED     RADIOLOGY  Lumbar spine x-ray images were reviewed and interpreted by myself independent of the radiologist and was negative for acute bony injury or degenerative disc disease.  Radiology report is negative.   PROCEDURES:  Critical Care performed:   Procedures   MEDICATIONS ORDERED IN ED: Medications  ketorolac (TORADOL) 30 MG/ML injection 30 mg (30 mg Intramuscular Given 10/26/22 7341)     IMPRESSION / MDM / ASSESSMENT AND PLAN / ED COURSE  I reviewed the triage vital signs and the nursing notes.   Differential diagnosis includes, but is not limited to, low  back pain, low back strain, urinary tract infection, degenerative disc disease.  31 year old female presents to the ED with complaint of low back pain without known history of injury.  Physical exam is benign with the exception of some tenderness on palpation of the paravertebral muscles lower lumbar area.  X-rays were reassuring and patient was made aware.  Also urinalysis was negative and CBC and BMP were unremarkable.  Patient was given Toradol 30 mg IM while in the ED and was more comfortable prior to discharge.  Patient was made aware that ibuprofen 600 mg every 6 hours and methocarbamol 500 mg every 6 hours was sent to the pharmacy to continue taking.  She is aware that she cannot take the methocarbamol she plans on driving or operating machinery as it could cause drowsiness.  She is encouraged to use ice or heat to her muscles as needed for discomfort and follow-up with her PCP if any continued problems.  Patient was ambulatory at the time of discharge without  any difficulty.      Patient's presentation is most consistent with acute complicated illness / injury requiring diagnostic workup.  FINAL CLINICAL IMPRESSION(S) / ED DIAGNOSES   Final diagnoses:  Acute bilateral low back pain without sciatica     Rx / DC Orders   ED Discharge Orders          Ordered    methocarbamol (ROBAXIN) 500 MG tablet  Every 6 hours PRN        10/26/22 0907    ibuprofen (ADVIL) 600 MG tablet        10/26/22 0488             Note:  This document was prepared using Dragon voice recognition software and may include unintentional dictation errors.   Tommi Rumps, PA-C 10/26/22 1015    Chesley Noon, MD 10/26/22 1536

## 2024-07-20 ENCOUNTER — Other Ambulatory Visit: Payer: Self-pay

## 2024-07-20 ENCOUNTER — Emergency Department: Payer: Self-pay

## 2024-07-20 ENCOUNTER — Emergency Department
Admission: EM | Admit: 2024-07-20 | Discharge: 2024-07-20 | Disposition: A | Discharge status: Home / Self Care | Payer: Self-pay | Attending: Emergency Medicine | Admitting: Emergency Medicine

## 2024-07-20 ENCOUNTER — Encounter: Payer: Self-pay | Admitting: Emergency Medicine

## 2024-07-20 DIAGNOSIS — E236 Other disorders of pituitary gland: Secondary | ICD-10-CM | POA: Insufficient documentation

## 2024-07-20 DIAGNOSIS — R519 Headache, unspecified: Secondary | ICD-10-CM

## 2024-07-20 DIAGNOSIS — E237 Disorder of pituitary gland, unspecified: Secondary | ICD-10-CM

## 2024-07-20 LAB — POC URINE PREG, ED: Preg Test, Ur: NEGATIVE

## 2024-07-20 MED ORDER — GADOBUTROL 1 MMOL/ML IV SOLN
10.0000 mL | Freq: Once | INTRAVENOUS | Status: AC | PRN
  Administered 2024-07-20: 10 mL via INTRAVENOUS

## 2024-07-20 MED ORDER — BUTALBITAL-APAP-CAFFEINE 50-325-40 MG PO TABS: 1.0000 | ORAL_TABLET | Freq: Four times a day (QID) | ORAL | 0 refills | Status: AC | PRN

## 2024-07-20 MED ORDER — KETOROLAC TROMETHAMINE 30 MG/ML IJ SOLN
30.0000 mg | Freq: Once | INTRAMUSCULAR | Status: AC
  Administered 2024-07-20: 30 mg via INTRAMUSCULAR
  Filled 2024-07-20: qty 1

## 2024-07-20 MED ORDER — BUTALBITAL-APAP-CAFFEINE 50-325-40 MG PO TABS
2.0000 | ORAL_TABLET | Freq: Once | ORAL | Status: AC
  Administered 2024-07-20: 2 via ORAL
  Filled 2024-07-20: qty 2

## 2024-07-20 MED ORDER — ONDANSETRON 4 MG PO TBDP
4.0000 mg | ORAL_TABLET | Freq: Once | ORAL | Status: AC
  Administered 2024-07-20: 4 mg via ORAL
  Filled 2024-07-20: qty 1

## 2024-07-20 NOTE — ED Provider Notes (Signed)
 [facility] Provider Note    Event Date/Time   First MD Initiated Contact with Patient 07/20/24 [phone]     (approximate)   History   Headache   HPI  Alexis Francis is a 33 y.o. female who presents to the ED for evaluation of Headache   Patient presents for evaluation of subacute intermittent headaches.  She reports frontal intermittent headache nearly every day for the past few weeks, transiently resolves with Tylenol  or ibuprofen , but it keeps coming back.  Checked her blood pressure once at home with her grandmother's wrist cuff in the setting of an acute headache and reports it was high.  She has not yet taken any medications this morning.  No syncope or falls, no fevers or vision changes, weakness or numbness to the extremities.   Physical Exam   Triage Vital Signs: ED Triage Vitals  Encounter Vitals Group     BP 07/20/24 0841 (!) 135/96     Girls Systolic BP Percentile --      Girls Diastolic BP Percentile --      Boys Systolic BP Percentile --      Boys Diastolic BP Percentile --      Pulse Rate 07/20/24 0841 (!) 102     Resp 07/20/24 0841 17     Temp 07/20/24 0841 98.4 F (36.9 C)     Temp Source 07/20/24 0841 Oral     SpO2 07/20/24 0841 99 %     Weight 07/20/24 0842 300 lb (136.1 kg)     Height 07/20/24 0842 5' 5 (1.651 m)     Head Circumference --      Peak Flow --      Pain Score 07/20/24 0841 4     Pain Loc --      Pain Education --      Exclude from Growth Chart --     Most recent vital signs: Vitals:   07/20/24 0841 07/20/24 1336  BP: (!) 135/96 (!) 130/90  Pulse: (!) 102 90  Resp: 17 16  Temp: 98.4 F (36.9 C) 98 F (36.7 C)  SpO2: 99% 99%    General: Awake, no distress.  Morbidly obese, appears somewhat uncomfortable but generally well.  Mild photophobia CV:  Good peripheral perfusion.  Resp:  Normal effort.  Abd:  No distention.  MSK:  No deformity noted.  Neuro:  No focal deficits appreciated. Cranial nerves  II through XII intact 5/5 strength and sensation in all 4 extremities Other:     ED Results / Procedures / Treatments   Labs (all labs ordered are listed, but only abnormal results are displayed) Labs Reviewed  POC URINE PREG, ED    EKG   RADIOLOGY   Official radiology report(s): CT HEAD WO CONTRAST ( ) Result Date: 07/20/2024 CLINICAL DATA:  Headache, hypertension. Evaluation for hemorrhage. Intermittent headache for 2-3 weeks. EXAM: CT HEAD WITHOUT CONTRAST TECHNIQUE: Contiguous axial images were obtained from the base of the skull through the vertex without intravenous contrast. RADIATION DOSE REDUCTION: This exam was performed according to the departmental dose-optimization program which includes automated exposure control, adjustment of the mA and/or kV according to patient size and/or use of iterative reconstruction technique. COMPARISON:  None Available. FINDINGS: Brain: No acute intracranial hemorrhage. No CT evidence of completed large territory infarct. No edema or midline shift. The basilar cisterns are patent. Posterior fossa is unremarkable. No extra-axial fluid collections. Question possible pituitary lesion seen on series 4 image 30 and series  5, image 30. Ventricles: The ventricles are normal. Vascular: No hyperdense vessel or unexpected calcification. Skull: No acute or aggressive finding. Orbits: Orbits are symmetric. Sinuses: Mild mucosal thickening in the ethmoid sinuses. Other: Mastoid air cells are clear. IMPRESSION: No CT evidence of acute intracranial abnormality. 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 Consider MRI sella/pituitary with and without contrast for further evaluation. Electronically Signed   By: Donnice Mania M.D.   On: 07/20/2024 10:13    PROCEDURES and INTERVENTIONS:  Procedures  Medications  ketorolac  (TORADOL ) 30 MG/ML injection 30 mg (30 mg Intramuscular Given 07/20/24 0932)  butalbital -acetaminophen -caffeine  (FIORICET ) 50-325-40 MG per tablet 2  tablet (2 tablets Oral Given 07/20/24 0932)  ondansetron  (ZOFRAN -ODT) disintegrating tablet 4 mg (4 mg Oral Given 07/20/24 0932)  gadobutrol  (GADAVIST ) 1 MMOL/ML injection 10 mL (10 mLs Intravenous Contrast Given 07/20/24 1407)     IMPRESSION / MDM / ASSESSMENT AND PLAN / ED COURSE  I reviewed the triage vital signs and the nursing notes.  Differential diagnosis includes, but is not limited to, IIH, migraine headache, ICH, tension headache, retrobulbar hematoma  {Patient presents with symptoms of an acute illness or injury that is potentially life-threatening.  33 year old woman presents to the ED with intermittent subacute headaches.  No neurologic deficits, trauma.  No meningeal features.  She is concerned about her elevated blood pressure at home and no significant history of headaches.  We will obtain a CT scan of the head.  No indication for MRI at this point.  Will provide nonnarcotic multimodal analgesia and reassess.  Awaiting MRI at the time of signout to oncoming physician follow-up study.    Clinical Course as of 07/20/24 1508  Mon Jul 20, 2024  1032 Reassessed.  She reports resolution of her headache and feeling much better.  We discussed CT results, pituitary lesion and possible significance of this.  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   Claudene Rover, MD 07/20/24 [phone]

## 2024-07-20 NOTE — ED Provider Notes (Signed)
 Asked to follow-up on MRI, no acute findings on MRI, no indication for admission at this time, patient will follow-up with neurology, analgesics provided.   Alexis Charleston, MD 07/20/24 [phone]

## 2024-07-20 NOTE — ED Triage Notes (Signed)
 Patient to ED via POV for headache. Intermittent x2-3 weeks. States it is sometimes better with tylenol .
